# Patient Record
Sex: Female | Born: 1960 | Race: Black or African American | Hispanic: No | Marital: Married | Smoking: Never smoker
Health system: Southern US, Community
[De-identification: ages and names within clinical notes are randomized; demographics above are authoritative.]

## PROBLEM LIST (undated history)

## (undated) DIAGNOSIS — R5383 Other fatigue: Secondary | ICD-10-CM

## (undated) DIAGNOSIS — R404 Transient alteration of awareness: Secondary | ICD-10-CM

## (undated) DIAGNOSIS — Z13220 Encounter for screening for lipoid disorders: Secondary | ICD-10-CM

## (undated) DIAGNOSIS — R7309 Other abnormal glucose: Secondary | ICD-10-CM

## (undated) DIAGNOSIS — J45909 Unspecified asthma, uncomplicated: Secondary | ICD-10-CM

## (undated) DIAGNOSIS — E119 Type 2 diabetes mellitus without complications: Secondary | ICD-10-CM

## (undated) DIAGNOSIS — M1990 Unspecified osteoarthritis, unspecified site: Secondary | ICD-10-CM

## (undated) DIAGNOSIS — K589 Irritable bowel syndrome without diarrhea: Secondary | ICD-10-CM

## (undated) DIAGNOSIS — J309 Allergic rhinitis, unspecified: Secondary | ICD-10-CM

## (undated) DIAGNOSIS — K3189 Other diseases of stomach and duodenum: Secondary | ICD-10-CM

## (undated) DIAGNOSIS — R5381 Other malaise: Secondary | ICD-10-CM

## (undated) DIAGNOSIS — R1013 Epigastric pain: Secondary | ICD-10-CM

## (undated) DIAGNOSIS — K219 Gastro-esophageal reflux disease without esophagitis: Secondary | ICD-10-CM

## (undated) DIAGNOSIS — L439 Lichen planus, unspecified: Secondary | ICD-10-CM

## (undated) DIAGNOSIS — D649 Anemia, unspecified: Secondary | ICD-10-CM

## (undated) DIAGNOSIS — K760 Fatty (change of) liver, not elsewhere classified: Secondary | ICD-10-CM

## (undated) HISTORY — DX: Allergic rhinitis, unspecified: J30.9

## (undated) HISTORY — DX: Encounter for screening for lipoid disorders: Z13.220

## (undated) HISTORY — DX: Epigastric pain: R10.13

## (undated) HISTORY — DX: Other abnormal glucose: R73.09

## (undated) HISTORY — DX: Anemia, unspecified: D64.9

## (undated) HISTORY — DX: Other diseases of stomach and duodenum: K31.89

## (undated) HISTORY — DX: Unspecified asthma, uncomplicated: J45.909

## (undated) HISTORY — DX: Other malaise: R53.81

## (undated) HISTORY — DX: Gastro-esophageal reflux disease without esophagitis: K21.9

## (undated) HISTORY — DX: Irritable bowel syndrome, unspecified: K58.9

## (undated) HISTORY — DX: Other fatigue: R53.83

## (undated) HISTORY — DX: Transient alteration of awareness: R40.4

## (undated) HISTORY — DX: Unspecified osteoarthritis, unspecified site: M19.90

## (undated) HISTORY — PX: CERVIX SURGERY: SHX593

---

## 1994-01-14 HISTORY — PX: TUBAL LIGATION: SHX77

## 2000-04-28 ENCOUNTER — Other Ambulatory Visit: Admission: RE | Admit: 2000-04-28 | Discharge: 2000-04-28 | Payer: Self-pay | Admitting: Obstetrics and Gynecology

## 2000-06-05 ENCOUNTER — Other Ambulatory Visit: Admission: RE | Admit: 2000-06-05 | Discharge: 2000-06-05 | Payer: Self-pay | Admitting: Obstetrics and Gynecology

## 2000-06-05 ENCOUNTER — Encounter (INDEPENDENT_AMBULATORY_CARE_PROVIDER_SITE_OTHER): Payer: Self-pay

## 2002-03-24 ENCOUNTER — Other Ambulatory Visit: Admission: RE | Admit: 2002-03-24 | Discharge: 2002-03-24 | Payer: Self-pay | Admitting: *Deleted

## 2002-04-15 LAB — HM COLONOSCOPY: HM Colonoscopy: NORMAL

## 2003-03-31 ENCOUNTER — Other Ambulatory Visit: Admission: RE | Admit: 2003-03-31 | Discharge: 2003-03-31 | Payer: Self-pay | Admitting: Obstetrics and Gynecology

## 2003-04-07 ENCOUNTER — Ambulatory Visit (HOSPITAL_COMMUNITY): Admission: RE | Admit: 2003-04-07 | Discharge: 2003-04-07 | Payer: Self-pay | Admitting: Obstetrics and Gynecology

## 2004-01-24 ENCOUNTER — Ambulatory Visit (HOSPITAL_COMMUNITY): Admission: RE | Admit: 2004-01-24 | Discharge: 2004-01-24 | Payer: Self-pay | Admitting: Obstetrics and Gynecology

## 2004-03-06 ENCOUNTER — Ambulatory Visit (HOSPITAL_COMMUNITY): Admission: RE | Admit: 2004-03-06 | Discharge: 2004-03-06 | Payer: Self-pay | Admitting: Obstetrics and Gynecology

## 2004-03-06 ENCOUNTER — Encounter (INDEPENDENT_AMBULATORY_CARE_PROVIDER_SITE_OTHER): Payer: Self-pay | Admitting: Specialist

## 2004-06-22 ENCOUNTER — Other Ambulatory Visit: Admission: RE | Admit: 2004-06-22 | Discharge: 2004-06-22 | Payer: Self-pay | Admitting: Obstetrics and Gynecology

## 2004-06-26 ENCOUNTER — Ambulatory Visit (HOSPITAL_COMMUNITY): Admission: RE | Admit: 2004-06-26 | Discharge: 2004-06-26 | Payer: Self-pay | Admitting: Obstetrics and Gynecology

## 2004-07-05 ENCOUNTER — Inpatient Hospital Stay (HOSPITAL_COMMUNITY): Admission: AD | Admit: 2004-07-05 | Discharge: 2004-07-05 | Payer: Self-pay | Admitting: Obstetrics and Gynecology

## 2004-07-12 ENCOUNTER — Encounter: Admission: RE | Admit: 2004-07-12 | Discharge: 2004-07-12 | Payer: Self-pay | Admitting: Obstetrics and Gynecology

## 2004-07-21 ENCOUNTER — Encounter: Admission: RE | Admit: 2004-07-21 | Discharge: 2004-07-21 | Payer: Self-pay | Admitting: Interventional Radiology

## 2004-08-31 ENCOUNTER — Ambulatory Visit (HOSPITAL_COMMUNITY): Admission: RE | Admit: 2004-08-31 | Discharge: 2004-08-31 | Payer: Self-pay | Admitting: Interventional Radiology

## 2004-10-09 ENCOUNTER — Encounter: Admission: RE | Admit: 2004-10-09 | Discharge: 2004-10-09 | Payer: Self-pay | Admitting: Interventional Radiology

## 2005-02-27 ENCOUNTER — Ambulatory Visit: Payer: Self-pay | Admitting: Internal Medicine

## 2005-05-14 ENCOUNTER — Ambulatory Visit: Payer: Self-pay | Admitting: Internal Medicine

## 2005-06-21 ENCOUNTER — Other Ambulatory Visit: Admission: RE | Admit: 2005-06-21 | Discharge: 2005-06-21 | Payer: Self-pay | Admitting: Obstetrics and Gynecology

## 2005-07-09 ENCOUNTER — Ambulatory Visit: Payer: Self-pay | Admitting: Internal Medicine

## 2005-07-09 ENCOUNTER — Encounter (INDEPENDENT_AMBULATORY_CARE_PROVIDER_SITE_OTHER): Payer: Self-pay | Admitting: Specialist

## 2005-09-10 ENCOUNTER — Ambulatory Visit: Payer: Self-pay | Admitting: Internal Medicine

## 2005-12-14 LAB — CONVERTED CEMR LAB: Pap Smear: NORMAL

## 2006-01-14 HISTORY — PX: ABDOMINAL HYSTERECTOMY: SHX81

## 2006-03-18 ENCOUNTER — Encounter (INDEPENDENT_AMBULATORY_CARE_PROVIDER_SITE_OTHER): Payer: Self-pay | Admitting: Specialist

## 2006-03-18 ENCOUNTER — Inpatient Hospital Stay (HOSPITAL_COMMUNITY): Admission: RE | Admit: 2006-03-18 | Discharge: 2006-03-20 | Payer: Self-pay | Admitting: Obstetrics and Gynecology

## 2006-07-03 ENCOUNTER — Ambulatory Visit (HOSPITAL_COMMUNITY): Admission: RE | Admit: 2006-07-03 | Discharge: 2006-07-03 | Payer: Self-pay | Admitting: Obstetrics and Gynecology

## 2006-12-15 ENCOUNTER — Ambulatory Visit: Payer: Self-pay | Admitting: Internal Medicine

## 2007-01-20 ENCOUNTER — Ambulatory Visit: Payer: Self-pay | Admitting: Family Medicine

## 2007-01-20 DIAGNOSIS — R5381 Other malaise: Secondary | ICD-10-CM | POA: Insufficient documentation

## 2007-01-20 DIAGNOSIS — R5383 Other fatigue: Secondary | ICD-10-CM

## 2007-01-20 DIAGNOSIS — J309 Allergic rhinitis, unspecified: Secondary | ICD-10-CM | POA: Insufficient documentation

## 2007-01-20 DIAGNOSIS — K219 Gastro-esophageal reflux disease without esophagitis: Secondary | ICD-10-CM

## 2007-01-20 DIAGNOSIS — K589 Irritable bowel syndrome without diarrhea: Secondary | ICD-10-CM | POA: Insufficient documentation

## 2007-01-27 ENCOUNTER — Ambulatory Visit: Payer: Self-pay | Admitting: Family Medicine

## 2007-01-27 DIAGNOSIS — R404 Transient alteration of awareness: Secondary | ICD-10-CM

## 2007-01-29 LAB — CONVERTED CEMR LAB
ALT: 22 units/L (ref 0–35)
AST: 20 units/L (ref 0–37)
Albumin: 4 g/dL (ref 3.5–5.2)
Basophils Absolute: 0 10*3/uL (ref 0.0–0.1)
Bilirubin, Direct: 0.1 mg/dL (ref 0.0–0.3)
Calcium: 9.3 mg/dL (ref 8.4–10.5)
Chloride: 108 meq/L (ref 96–112)
Cholesterol: 130 mg/dL (ref 0–200)
Eosinophils Absolute: 0.1 10*3/uL (ref 0.0–0.6)
Eosinophils Relative: 1.2 % (ref 0.0–5.0)
GFR calc Af Amer: 100 mL/min
GFR calc non Af Amer: 83 mL/min
Glucose, Bld: 111 mg/dL — ABNORMAL HIGH (ref 70–99)
HDL: 56 mg/dL (ref >39.0)
Lymphocytes Relative: 29.4 % (ref 12.0–46.0)
MCHC: 34 g/dL (ref 30.0–36.0)
MCV: 93 fL (ref 78.0–100.0)
Monocytes Relative: 8.9 % (ref 3.0–11.0)
Neutro Abs: 2.5 10*3/uL (ref 1.4–7.7)
Platelets: 242 10*3/uL (ref 150–400)
RBC: 4.18 M/uL (ref 3.87–5.11)
Sed Rate: 13 mm/hr (ref 0–25)
TSH: 0.64 microintl units/mL (ref 0.35–5.50)
Total CHOL/HDL Ratio: 2.3
Triglycerides: 33 mg/dL (ref 0–149)

## 2007-02-02 ENCOUNTER — Ambulatory Visit: Payer: Self-pay | Admitting: Family Medicine

## 2007-02-03 LAB — CONVERTED CEMR LAB
Basophils Relative: 0.5 % (ref 0.0–1.0)
Eosinophils Relative: 1.4 % (ref 0.0–5.0)
HCT: 37.6 % (ref 36.0–46.0)
Homocysteine: 7.6 micromoles/L (ref 5.00–13.90)
Neutrophils Relative %: 60.7 % (ref 43.0–77.0)
Platelets: 213 10*3/uL (ref 150–400)
RBC: 4.05 M/uL (ref 3.87–5.11)
WBC: 4 10*3/uL — ABNORMAL LOW (ref 4.5–10.5)

## 2007-02-05 DIAGNOSIS — R7309 Other abnormal glucose: Secondary | ICD-10-CM

## 2007-02-05 DIAGNOSIS — R1013 Epigastric pain: Secondary | ICD-10-CM | POA: Insufficient documentation

## 2007-02-05 DIAGNOSIS — D72819 Decreased white blood cell count, unspecified: Secondary | ICD-10-CM | POA: Insufficient documentation

## 2007-02-05 DIAGNOSIS — K3189 Other diseases of stomach and duodenum: Secondary | ICD-10-CM | POA: Insufficient documentation

## 2007-08-07 ENCOUNTER — Ambulatory Visit (HOSPITAL_COMMUNITY): Admission: RE | Admit: 2007-08-07 | Discharge: 2007-08-07 | Payer: Self-pay | Admitting: Obstetrics and Gynecology

## 2007-08-20 ENCOUNTER — Ambulatory Visit: Payer: Self-pay | Admitting: Internal Medicine

## 2007-09-03 ENCOUNTER — Ambulatory Visit: Payer: Self-pay | Admitting: Internal Medicine

## 2007-10-29 ENCOUNTER — Telehealth: Payer: Self-pay | Admitting: Internal Medicine

## 2007-10-30 DIAGNOSIS — Z8719 Personal history of other diseases of the digestive system: Secondary | ICD-10-CM | POA: Insufficient documentation

## 2007-11-02 ENCOUNTER — Ambulatory Visit: Payer: Self-pay | Admitting: Internal Medicine

## 2008-01-13 ENCOUNTER — Ambulatory Visit: Payer: Self-pay | Admitting: Family Medicine

## 2008-01-13 DIAGNOSIS — F341 Dysthymic disorder: Secondary | ICD-10-CM | POA: Insufficient documentation

## 2008-01-13 DIAGNOSIS — R002 Palpitations: Secondary | ICD-10-CM | POA: Insufficient documentation

## 2008-01-13 DIAGNOSIS — R0602 Shortness of breath: Secondary | ICD-10-CM

## 2008-02-02 ENCOUNTER — Ambulatory Visit: Payer: Self-pay | Admitting: Family Medicine

## 2008-02-02 ENCOUNTER — Ambulatory Visit: Payer: Self-pay | Admitting: Professional

## 2008-02-03 LAB — CONVERTED CEMR LAB
AST: 15 units/L (ref 0–37)
Albumin: 4 g/dL (ref 3.5–5.2)
BUN: 7 mg/dL (ref 6–23)
Basophils Relative: 1 % (ref 0–1)
CO2: 24 meq/L (ref 19–32)
Calcium: 9.2 mg/dL (ref 8.4–10.5)
Chloride: 106 meq/L (ref 96–112)
Cholesterol: 117 mg/dL (ref 0–200)
FSH: 31.4 milliintl units/mL
Glucose, Bld: 89 mg/dL (ref 70–99)
HDL: 58 mg/dL (ref >39)
Hemoglobin: 12.3 g/dL (ref 12.0–15.0)
LH: 10.1 milliintl units/mL
Lymphocytes Relative: 32 % (ref 12–46)
Lymphs Abs: 1.3 10*3/uL (ref 0.7–4.0)
Monocytes Relative: 10 % (ref 3–12)
Neutro Abs: 2.3 10*3/uL (ref 1.7–7.7)
Neutrophils Relative %: 56 % (ref 43–77)
Potassium: 4.3 meq/L (ref 3.5–5.3)
RBC: 3.98 M/uL (ref 3.87–5.11)
TSH: 1.113 microintl units/mL (ref 0.350–4.50)
WBC: 4.1 10*3/uL (ref 4.0–10.5)

## 2008-11-10 ENCOUNTER — Ambulatory Visit (HOSPITAL_COMMUNITY): Admission: RE | Admit: 2008-11-10 | Discharge: 2008-11-10 | Payer: Self-pay | Admitting: Obstetrics and Gynecology

## 2009-01-05 ENCOUNTER — Telehealth: Payer: Self-pay | Admitting: Internal Medicine

## 2009-04-13 ENCOUNTER — Ambulatory Visit: Payer: Self-pay | Admitting: Internal Medicine

## 2009-04-13 DIAGNOSIS — L290 Pruritus ani: Secondary | ICD-10-CM | POA: Insufficient documentation

## 2009-09-13 ENCOUNTER — Ambulatory Visit: Payer: Self-pay | Admitting: Family Medicine

## 2009-09-13 DIAGNOSIS — L259 Unspecified contact dermatitis, unspecified cause: Secondary | ICD-10-CM

## 2009-09-25 ENCOUNTER — Telehealth: Payer: Self-pay | Admitting: Family Medicine

## 2009-09-27 ENCOUNTER — Ambulatory Visit: Payer: Self-pay | Admitting: Family Medicine

## 2009-12-18 ENCOUNTER — Observation Stay: Payer: Self-pay | Admitting: Internal Medicine

## 2009-12-19 ENCOUNTER — Encounter: Payer: Self-pay | Admitting: Family Medicine

## 2010-02-04 ENCOUNTER — Encounter: Payer: Self-pay | Admitting: Interventional Radiology

## 2010-02-13 NOTE — Progress Notes (Signed)
Summary: reaction to triamcinolone  Phone Note Call from Patient Call back at [phone]   Caller: Patient Summary of Call: Pt has been using triamcinolone for almost 2 weeks.  She says this has caused a rash on her face and dizziness.  She stopped using the cream last night and is asking what she can do about the rash on her face.  She will use benedryl and see if that helps.  [facility]. Initial call taken by: Lowella Petties CMA,  September 25, 2009 12:17 PM  Follow-up for Phone Call        Dr. B rec f/u in 1-2 weeks if not improving.  dizziness would be almost unheard of from topical TAC.   Reassess to see if not dermatitis Follow-up by: Hannah Beat MD,  September 25, 2009 12:34 PM  Additional Follow-up for Phone Call Additional follow up Details #1::        Patient notified as instructed by telephone. Appointment scheduled with Dr. Ermalene Searing on 09/27/09. Additional Follow-up by: Sydell Axon LPN,  September 25, 2009 5:29 PM

## 2010-02-13 NOTE — Assessment & Plan Note (Signed)
Summary: follow-up on dermatitis   Vital Signs:  Patient profile:   50 year old female Height:      62.5 inches Weight:      145.0 pounds BMI:     26.19 Temp:     98.5 degrees F oral Pulse rate:   72 / minute Pulse rhythm:   regular BP sitting:   98 / 60  (left arm) Cuff size:   regular  Vitals Entered By: Benny Lennert CMA Duncan Dull) (September 27, 2009 11:59 AM)  History of Present Illness: Chief complaint follow up dermatitis  Skin changes on left hip/buttock...since end of July.  Dry, itchy...initially reddish..gets darker as it drys up. Area initially felt warm and sensitive to touch.  Tried OTC hydrocortisone cream...minimal imporvement.  No other location on body.  No new meds, no new vitamin...some new detergent in past few weeks. Has since swithched back   No new lotions, no new exposures.  Given triamcinolone 0.5 % 1 months ago...improved now...almost gone now...some mild darkness remains. Realized may be nickle allergy in area.  Has noted that red itchy rash on face since starting the cream. HAs improved some now.  occ dizzyness over the weekend gone now.  Started taking benadryl..heped alot.   Problems Prior to Update: 1)  Contact Dermatitis  (ICD-692.9) 2)  Pruritus Ani  (ICD-698.0) 3)  Shortness of Breath  (ICD-786.05) 4)  Palpitations  (ICD-785.1) 5)  Depression/anxiety  (ICD-300.4) 6)  Irritable Bowel Syndrome, Hx of  (ICD-V12.79) 7)  Gastritis, Hx of  (ICD-V12.79) 8)  Fh of Colon Cancer  (ICD-153.9) 9)  Epigastric Discomfort  (ICD-789.06) 10)  Dyspepsia, Nonulcerative  (ICD-536.8) 11)  Prediabetes  (ICD-790.29) 12)  Leukopenia, Mild  (ICD-288.50) 13)  Other Alteration of Consciousness  (ICD-780.09) 14)  Screening For Lipoid Disorders  (ICD-V77.91) 15)  Fatigue  (ICD-780.79) 16)  Allergic Rhinitis  (ICD-477.9) 17)  Ibs  (ICD-564.1) 18)  Gerd  (ICD-530.81)  Current Medications (verified): 1)  Nexium 40 Mg  Cpdr (Esomeprazole Magnesium) ....  Take 1 Capsule By Mouth Once A Day 2)  Analpram-Hc 1-2.5 % Crea (Hydrocortisone Ace-Pramoxine) .... Apply To Rectum Three Times A Day  Allergies: 1)  ! Penicillin 2)  ! Amoxicillin  Past History:  Past medical, surgical, family and social histories (including risk factors) reviewed, and no changes noted (except as noted below).  Past Medical History: Reviewed history from 02/05/2007 and no changes required. Current Problems:  EPIGASTRIC DISCOMFORT (ICD-789.06) DYSPEPSIA, NONULCERATIVE (ICD-536.8) PREDIABETES (ICD-790.29) LEUKOPENIA, MILD (ICD-288.50) OTHER ALTERATION OF CONSCIOUSNESS (ICD-780.09) SCREENING FOR LIPOID DISORDERS (ICD-V77.91) FATIGUE (ICD-780.79) ALLERGIC RHINITIS (ICD-477.9) IBS (ICD-564.1) GERD (ICD-530.81)      Past Surgical History: Reviewed history from 10/30/2007 and no changes required. 2008 hysterectomy, removed cervix but left ovaries behind: congested pelvic syndrome C-sections x 2 endoscopy 6/07 nml Tubal ligation 1996  GYN surgery 02/06  Family History: Reviewed history from 10/30/2007 and no changes required. father DM, HTN mother: HTN, IBS, hypothyroidism 2 brothers: HTN, GERD sister: healthy MGF: leukemia, oral cancer Family History of Colon Cancer: Uncle  Social History: Reviewed history from 01/20/2007 and no changes required. Occupation: Transport planner Married 3 sons: autistic, borderline DM Never Smoked Alcohol use-no Drug use-no Regular exercise-yes 2-3 x per week Diet: fruits and veggies, chikn , fish, loves sweets  Physical Exam  General:  Well-developed,well-nourished,in no acute distress; alert,appropriate and cooperative throughout examination Mouth:  Oral mucosa and oropharynx without lesions or exudates.  Teeth in good repair. Lungs:  Normal respiratory effort, chest expands  symmetrically. Lungs are clear to auscultation, no crackles or wheezes. Heart:  Normal rate and regular rhythm. S1 and S2 normal without  gallop, murmur, click, rub or other extra sounds. Skin:  dry flaky dark  pathces on left hip..in linear distribution, present but faded significantly no blisters no pustules.   On face...multiple erythematous papules, no pustules, no oral lesions   Impression & Recommendations:  Problem # 1:  CONTACT DERMATITIS (ICD-692.9) Resolving, but possible allergic reaction on face from triamcinolone. Stop this med and other topical creams. If facial resh not continuign to improve or if leg rash returns. Call for possiblel derm referral.  The following medications were removed from the medication list:    Triamcinolone Acetonide 0.5 % Crea (Triamcinolone acetonide) .Marland Kitchen... Apply to affected area two times a day  no longer than 2 weeks.  Complete Medication List: 1)  Nexium 40 Mg Cpdr (Esomeprazole magnesium) .... Take 1 capsule by mouth once a day 2)  Analpram-hc 1-2.5 % Crea (Hydrocortisone ace-pramoxine) .... Apply to rectum three times a day  Current Allergies (reviewed today): ! PENICILLIN ! AMOXICILLIN

## 2010-02-13 NOTE — Assessment & Plan Note (Signed)
Summary: CHECK DARK PATCH ON HIP/CLE   Vital Signs:  Patient profile:   50 year old female Height:      62.5 inches Weight:      143.25 pounds BMI:     25.88 Temp:     99.0 degrees F oral Pulse rate:   72 / minute Pulse rhythm:   regular BP sitting:   130 / 86  (left arm) Cuff size:   regular  Vitals Entered By: Linde Gillis CMA Duncan Dull) (September 13, 2009 2:15 PM)  CC: check dark patches on left thigh   History of Present Illness: Skin cahnges on left hip/buttock...since end of July.  Dry, itchy...initially reddish..gets darker as it drys up. Area initially felt warm and sensitive to touch.  Tried OTC hydrocortisone cream...minimal imporvement.  No other location on body.  No new meds, no new vitamin...some new detergent in past few weeks. Has since swithched back   No new lotions, no new exposures.  Current Medications (verified): 1)  Nexium 40 Mg  Cpdr (Esomeprazole Magnesium) .... Take 1 Capsule By Mouth Once A Day 2)  Analpram-Hc 1-2.5 % Crea (Hydrocortisone Ace-Pramoxine) .... Apply To Rectum Three Times A Day 3)  Triamcinolone Acetonide 0.5 % Crea (Triamcinolone Acetonide) .... Apply To Affected Area Two Times A Day  No Longer Than 2 Weeks.  Allergies: 1)  ! Penicillin 2)  ! Amoxicillin  Review of Systems General:  Denies fatigue and fever. CV:  Denies chest pain or discomfort. Resp:  Denies shortness of breath.  Physical Exam  General:  Well-developed,well-nourished,in no acute distress; alert,appropriate and cooperative throughout examination Mouth:  MMM Lungs:  Normal respiratory effort, chest expands symmetrically. Lungs are clear to auscultation, no crackles or wheezes. Heart:  Normal rate and regular rhythm. S1 and S2 normal without gallop, murmur, click, rub or other extra sounds. Skin:  dry flaky dark to pink pathces on left hip..in linear distribution no blisters no pustules.    Impression & Recommendations:  Problem # 1:  CONTACT DERMATITIS  (ICD-692.9) No clear blisters, no pustules..some sensitivity at site, but not consistent with appearance of shingles or time course of shingles. Most consistent with dermatitis.Marland Kitchentreat with topical steroid.  Follow up if not improving in 1-2 weeks. Consider bx or derm referral if not improving, Her updated medication list for this problem includes:    Triamcinolone Acetonide 0.5 % Crea (Triamcinolone acetonide) .Marland Kitchen... Apply to affected area two times a day  no longer than 2 weeks.  Complete Medication List: 1)  Nexium 40 Mg Cpdr (Esomeprazole magnesium) .... Take 1 capsule by mouth once a day 2)  Analpram-hc 1-2.5 % Crea (Hydrocortisone ace-pramoxine) .... Apply to rectum three times a day 3)  Triamcinolone Acetonide 0.5 % Crea (Triamcinolone acetonide) .... Apply to affected area two times a day  no longer than 2 weeks.  Patient Instructions: 1)  Apply steroid cream to affected area. 2)  Call if not improving after 1-2 weeks.  Prescriptions: TRIAMCINOLONE ACETONIDE 0.5 % CREA (TRIAMCINOLONE ACETONIDE) Apply to affected area two times a day  no longer than 2 weeks.  #15-30 gm x 0   Entered and Authorized by:   Kerby Nora MD   Signed by:   Kerby Nora MD on 09/13/2009   Method used:   [facility] to        [facility]  [address] [address] [phone][facility] (retail[facility]       [address]       [place]  [place], [place]  [address]       Ph: [phone] or [phone]       Fax: [phone]   RxID:   [id number]   Current Allergies (reviewed today): ! PENICILLIN ! AMOXICILLIN

## 2010-02-13 NOTE — Assessment & Plan Note (Signed)
Summary: ABD DISCOMFORT/YF    History of Present Illness Visit Type: Follow-up Visit Primary GI MD: Lina Sar MD Primary Provider: Sherril Cong Chief Complaint: Intermittant crampy abd pain with some constipation. Pt states she has noticed a increase in rectal itching with some BRB on occasion but she has a history of hemorrhoids.  History of Present Illness:   This is a 50 year old African American female who is here to discuss her rectal itching and irritation. We have seen her in the past for screening colonoscopy because of her family history of colon cancer in a paternal uncle. Her last colonoscopy exam in August 2009 was normal. She is experiencing pruritus around the anal area especially at night. Occasionally she has bleeding from scratching. Her bowel habits tend to be constipated. She denies history of hemorrhoids but had a rectal tear during her vaginal delivery.   GI Review of Systems    Reports abdominal pain and  nausea.     Location of  Abdominal pain: generalized.    Denies acid reflux, belching, bloating, chest pain, dysphagia with liquids, dysphagia with solids, heartburn, loss of appetite, vomiting, vomiting blood, weight loss, and  weight gain.      Reports constipation, hemorrhoids, irritable bowel syndrome, rectal bleeding, and  rectal pain.     Denies anal fissure, black tarry stools, change in bowel habit, diarrhea, diverticulosis, fecal incontinence, heme positive stool, jaundice, light color stool, and  liver problems.    Current Medications (verified): 1)  Nexium 40 Mg  Cpdr (Esomeprazole Magnesium) .... Take 1 Capsule By Mouth Once A Day  Allergies (verified): 1)  ! Penicillin 2)  ! Amoxicillin  Past History:  Past Medical History: Reviewed history from 02/05/2007 and no changes required. Current Problems:  EPIGASTRIC DISCOMFORT (ICD-789.06) DYSPEPSIA, NONULCERATIVE (ICD-536.8) PREDIABETES (ICD-790.29) LEUKOPENIA, MILD (ICD-288.50) OTHER ALTERATION  OF CONSCIOUSNESS (ICD-780.09) SCREENING FOR LIPOID DISORDERS (ICD-V77.91) FATIGUE (ICD-780.79) ALLERGIC RHINITIS (ICD-477.9) IBS (ICD-564.1) GERD (ICD-530.81)      Past Surgical History: Reviewed history from 10/30/2007 and no changes required. 2008 hysterectomy, removed cervix but left ovaries behind: congested pelvic syndrome C-sections x 2 endoscopy 6/07 nml Tubal ligation 1996  GYN surgery 02/06  Family History: Reviewed history from 10/30/2007 and no changes required. father DM, HTN mother: HTN, IBS, hypothyroidism 2 brothers: HTN, GERD sister: healthy MGF: leukemia, oral cancer Family History of Colon Cancer: Uncle  Social History: Reviewed history from 01/20/2007 and no changes required. Occupation: Transport planner Married 3 sons: autistic, borderline DM Never Smoked Alcohol use-no Drug use-no Regular exercise-yes 2-3 x per week Diet: fruits and veggies, chikn , fish, loves sweets  Review of Systems  The patient denies allergy/sinus, anemia, anxiety-new, arthritis/joint pain, back pain, blood in urine, breast changes/lumps, change in vision, confusion, cough, coughing up blood, depression-new, fainting, fatigue, fever, headaches-new, hearing problems, heart murmur, heart rhythm changes, itching, menstrual pain, muscle pains/cramps, night sweats, nosebleeds, pregnancy symptoms, shortness of breath, skin rash, sleeping problems, sore throat, swelling of feet/legs, swollen lymph glands, thirst - excessive , urination - excessive , urination changes/pain, urine leakage, vision changes, and voice change.         Pertinent positive and negative review of systems were noted in the above HPI. All other ROS was otherwise negative.   Vital Signs:  Patient profile:   50 year old female Height:      62.5 inches Weight:      145.13 pounds BMI:     26.22 Pulse rate:   80 / minute Pulse rhythm:  regular BP sitting:   118 / 72  (right arm) Cuff size:   regular  Vitals  Entered By: Christie Nottingham CMA Duncan Dull) (April 13, 2009 3:53 PM)  Physical Exam  General:  Well developed, well nourished, no acute distress. Mouth:  No deformity or lesions, dentition normal. Neck:  Supple; no masses or thyromegaly. Lungs:  Clear throughout to auscultation. Heart:  Regular rate and rhythm; no murmurs, rubs,  or bruits. Abdomen:  Soft, nontender and nondistended. No masses, hepatosplenomegaly or hernias noted. Normal bowel sounds. Rectal:  rectal and anoscopic exam reveals a small hemorrhoidal tag at the rectovaginal wall which prolapses through the anal canal. There is no fissure. Rectal tone is normal. The anal mucosa and rectal ampulla mucosa are both normal. There is a small amount of Hemoccult negative stool and a small amount of stool leaks out to perianal area. Extremities:  No clubbing, cyanosis, edema or deformities noted.   Impression & Recommendations:  Problem # 1:  PRURITUS ANI (ICD-698.0) Patient has external hemorrhoids and skin tags most likely causing rectal irritation. I doubt we are dealing with parasites. We will obtain stool for ova and parasites x 3 for reassurance that there are no parasites and we will start her on Analpram cream 3 times a day and Anusol-HC suppositories q.h.s. She received samples of Calmoseptine ointment to use p.r.n. I advised her to use tux rather than toilet paper. Orders: T-Stool for O&P ([id number]) T-Stool for O&P ([id number]) T-Stool for O&P ([id number])  Patient Instructions: 1)  Please pick up your prescription for anusol suppositories to take at bedtime. 2)  Pick up your prescription fo Analpram to apply to the rectum three times a day. 3)  recall colonoscopy August 2016 4)  Please go to the lab on the basement floor to get stool containers. 5)  Copy sent to : Kerby Nora, MD 6)  The medication list was reviewed and reconciled.  All changed / newly prescribed medications were explained.  A complete medication list  was provided to the patient / caregiver. Prescriptions: ANALPRAM-HC 1-2.5 % CREA (HYDROCORTISONE ACE-PRAMOXINE) Apply to rectum three times a day  #30 grams x 0   Entered by:   Hortense Ramal CMA (AAMA)   Authorized by:   Hart Carwin MD   Signed by:   Hortense Ramal CMA (AAMA) on 04/13/2009   Method used:   [facility]  [address] [phone]       [address]       [place], [place]  [address]       Ph: [phone] or [phone]       Fax: [phone]   RxID:   [id number] ANUSOL-HC 25 MG SUPP (HYDROCORTISONE ACETATE) Insert into rectum at bedtime  #12 x 1   Entered by:   Hortense Ramal CMA (AAMA)   Authorized by:   Hart Carwin MD   Signed by:   Hortense Ramal CMA (AAMA) on 04/13/2009   Method used:   [facility]        [facility]  [address] [phone][facility] (retail[facility]       [address]       [place], [place]  [address]       Ph: [phone] or [phone]       Fax: [phone]   RxID:   [id number]  Appended Document: ABD DISCOMFORT/YF ---- 05/09/2009 1:24 PM, Karen Kitchens Nelson-Smith CMA (AAMA) wrote: Dr Juanda Chance-  Patient was having some rectal itching at her office visit on 04/13/09....we ordered O&P x 3 for her...Marland Kitchenhowever, she never turned those in. Do you still need those or would they be irrelevant at this point?  ---- 05/09/2009 1:28 PM, Hart Carwin MD wrote: Patient was the one to request the test. I will not reorder it at this point.

## 2010-02-15 NOTE — Letter (Signed)
Summary: [facility]  [facility]   Imported By: Maryln Gottron 12/25/2009 12:13:18  _____________________________________________________________________  External Attachment:    Type:   Image     Comment:   External Document

## 2010-05-29 NOTE — Assessment & Plan Note (Signed)
[facility]                         GASTROENTEROLOGY OFFICE NOTE   Brittany Stanton, Brittany Stanton                    MRN:          [id number]  DATE:12/15/2006                            DOB:          [birth year]    Brittany Stanton is a very nice 50 year old [place] American female, former  patient of Dr. Micah Noel , who has a history of gastroesophageal reflux.  We  saw her initially in June 2007, for dyspepsia and epigastric discomfort.  An upper endoscopy then showed an essentially normal exam but the  biopsies at the GE junction showed mild inflammation consistent with  reflux.  She was put on Nexium 40 mg twice a day which she then was able  to use at a once a day dosage.  She is doing quite well on that regimen.  What really helped her a lot was her dietary modification.  She has  eliminated soft drinks, fried foods, and spicy foods.  Her weight has  remained stable.  She has learned how to handle stress better.  She  comes today  for refill of her medications.  She is just getting over an  upper respiratory infection.   PHYSICAL EXAMINATION:  VITAL SIGNS:  Blood pressure 130/70, pulse 70,  and weight 147 pounds.  The patient was not examined.   IMPRESSION:  A 49 year old African American female with gastroesophageal  reflux disease under good control on Nexium 40 mg a day.   PLAN:  1. [facility].  2. Recommendation for internal medicine followup with Dr. Kerby Nora      at [facility].  3. Recall colonoscopy in July 2011.  She has a family history of colon      cancer in indirect relative which was her mother's brother.     Hedwig Morton. Juanda Chance, MD  Electronically Signed    DMB/MedQ  DD: 12/15/2006  DT: 12/15/2006  Job #: [id number]   cc:   Kerby Nora, MD

## 2010-06-01 NOTE — H&P (Signed)
Brittany Stanton, Brittany Stanton             ACCOUNT NO.:  [id number]   MEDICAL RECORD NO.:  [id number]           PATIENT TYPE:   LOCATION:                                 FACILITY:   PHYSICIAN:  Naima A. Dillard, M.D.      DATE OF BIRTH:   DATE OF ADMISSION:  DATE OF DISCHARGE:                                HISTORY & PHYSICAL   CHIEF COMPLAINT:  Uterine fibroids, dysmenorrhea, and irregular bleeding.   HISTORY AND PHYSICAL:  The patient is a 50 year old African-American female,  gravida 3, para 3, who presented with irregular bleeding over the last year.  She had some irregular spotting in between periods.  She also stays that her  periods have been heavy and painful.  The patient had an ultrasound  significant for a uterine size of 9.6 x 5.4 x 6.4 cm with several small  fibroids.  There was also a 9 x 7 x 10 mm hyperechoic area in the  endometrium which was felt to represent a polyp; however, the patient had a  sonohystogram by Dr. Pennie Rushing back in May of 2005, which showed a 2.4 cm  mixed echogenicity area significant for a degenerating fibroid.  The  patient's cervical cultures were found to be negative.   PAST SURGICAL HISTORY:  1.  Significant for a tubal ligation.  2.  Cesarean section x2.   PAST MEDICAL HISTORY:  1.  The patient denies any bleeding disorder.  2.  She does have irritable bowel syndrome.   PAST OBSTETRICAL HISTORY:  1.  Cesarean section x2.  2.  VBAC x1.   PAST GYNECOLOGIC HISTORY:  The patient denies having any abnormal Pap  smears.  She did have a history of Chlamydia back in 1990, and as above.   SOCIAL HISTORY:  Negative for alcohol, tobacco, or drug use.   REVIEW OF SYSTEMS:  ENDOCRINE:  Unremarkable.  GI:  Significant for  irritable bowel syndrome.  CARDIOVASCULAR:  Unremarkable.  PSYCHIATRIC:  Unremarkable.  MUSCULOSKELETAL:  Unremarkable.   ALLERGIES:  1.  SEASONAL.  2.  DUST.  3.  MOLD.  4.  GRASS.  5.  Questionable PENICILLIN allergy.   PHYSICAL EXAMINATION:  VITAL SIGNS:  The patient's blood pressure is 118/60.  Her weight is 131 pounds.  HEENT:  Pupils are equal.  Hearing is normal.  Throat is clear.  Thyroid is  not enlarged.  HEART:  Regular rate and rhythm.  CHEST:  Clear to auscultation bilaterally.  BREASTS:  No masses, discharge, skin changes, or nipple retraction  bilaterally.  BACK:  No CVA tenderness.  ABDOMEN:  Nontender without any masses or organomegaly.  EXTREMITIES:  No clubbing, cyanosis, or edema.  NEUROLOGIC:  Within normal limits.  PELVIC:  Vaginal exam within normal limits.  Uterus top normal size, about  10 weeks size, irregular shape, nontender.  Adnexa had no masses and  nontender.  RECTOVAGINAL:  Without any masses.   PERTINENT LABORATORIES:  TSH was 1.472, prolactin was 11.4.   ASSESSMENT:  Uterine fibroids, irregular vaginal bleeding, and questionable  uterine polyp, or submucosal fibroid, and dysmenorrhea.   PLAN:  The patient plans for a D&C hysteroscopy, removal of fibroid.  The  patient was given the options of D&C hysteroscopy, observation,  hysterectomy, uterine artery embolization.  The patient has chosen to  proceed with hysteroscopy.  Risks and benefits of all the procedures were  reviewed.  The patient understands the risks including, but not limited to,  bleeding, infection, damage to internal organs such as bowel, bladder, and  major blood vessels.  Today, a laminaria __________ was placed, and the  patient is ready to proceed with surgery.      NAD/MEDQ  D:  03/05/2004  T:  03/05/2004  Job:  [id number]

## 2010-06-01 NOTE — Assessment & Plan Note (Signed)
[facility]                           GASTROENTEROLOGY OFFICE NOTE   NAME:Rainone, [facility]                      MRN:          [id number]  DATE:09/10/2005                            DOB:          02/19/1960    Ms. Bucks is a very nice 50 year old African American female with a non-  ulcer dyspepsia, and gastroesophageal reflux.  Repeat upper endoscopy in  June 2007, confirmed presence of mild inflammation at the GE junction,  consistent with reflux.  She was put on Flora-Q probiotic as well as Nexium  40 mg a day with almost complete resolution of her symptoms.  She still has  to be very careful in eating, avoiding fatty foods.  She is very busy at  work and stress seems to bring on some of her symptoms.  Overall, she has  been satisfied with her regimen.   We have discussed today non-ulcer dyspepsia, gastroesophageal reflux.   She is to continue on a low fat, high fiber diet.   1. Nexium 40 mg a day.  2. I have added Levsin sublingually 0.125 mg p.r.n. abdominal discomfort.      This could be used while she is at work when she is stressed out, up to      3 times a day.   I would like her to come and discuss her condition in about 6-12 months.   I have also given her high fiber diet.                                   Hedwig Morton. Juanda Chance, MD   DMB/MedQ  DD:  09/10/2005  DT:  09/10/2005  Job #:  [id number]   cc:   Angelique Holm, Dr.

## 2010-06-01 NOTE — Op Note (Signed)
Brittany Stanton, Brittany Stanton             ACCOUNT NO.:  [id number]   MEDICAL RECORD NO.:  [id number]          PATIENT TYPE:  AMB   LOCATION:  SDC                           FACILITY:  [facility]   PHYSICIAN:  Naima A. Dillard, M.D. DATE OF BIRTH:  Sep 22, 1960   DATE OF PROCEDURE:  03/06/2004  DATE OF DISCHARGE:                                 OPERATIVE REPORT   PREOPERATIVE DIAGNOSES:  1.  Dysmenorrhea.  2.  Uterine fibroids.  3.  Uterine polyp versus submucosal fibroid.   POSTOPERATIVE DIAGNOSES:  1.  Dysmenorrhea.  2.  Uterine fibroids.   PROCEDURE:  Dilatation and curettage, hysteroscopy.   SURGEON:  Naima A. Dillard, M.D.   ASSISTANT:  None.   ANESTHESIA:  General laryngeal mask airway.   SPECIMENS:  Endometrial curettings.   ESTIMATED BLOOD LOSS:  Minimal.   COMPLICATIONS:  None.   DEFICIT:  110 mL of 3% sorbitol fluid.   The patient went to PACU in stable condition.   PROCEDURE IN DETAIL:  The patient was taken to the operating room, where she  was given general laryngeal mask airway anesthesia and prepped and draped in  the normal sterile fashion.  A bivalve speculum was placed into the vagina.  The laminaria that was placed in the office had already fallen out and it  was thrown in the trash.  The anterior lip of the cervix was grasped with a  single-tooth tenaculum.  The uterine sound was placed through the cervix  into the uterus and sounded to about 10 cm.  The cervix was further dilated  with Pratt dilators to 17.  A diagnostic hysteroscope was placed into the  uterine cavity, and both ostia were visualized.  The entire uterine cavity  was visualized.  There was abundant fluffy endometrium, no polyp or  submucosal fibroid was noted.  The  hysteroscope was then removed and a sharp curettage was done of the uterine  cavity until a gritty texture was noted.  The tenaculum was removed from the  cervix with hemostasis noted.  The sponge, lap and needle counts were  correct  x2.  The patient went to the recovery room in stable condition.      NAD/MEDQ  D:  03/06/2004  T:  03/06/2004  Job:  [id number]

## 2010-06-01 NOTE — Op Note (Signed)
Brittany Stanton, Brittany Stanton             ACCOUNT NO.:  [id number]   MEDICAL RECORD NO.:  [id number]          PATIENT TYPE:  INP   LOCATION:  9313                          [facility]ACILITY:  [facility]   PHYSICIAN:  Naima A. Dillard, M.D. DATE OF BIRTH:  10-Aug-1960   DATE OF PROCEDURE:  03/18/2006  DATE OF DISCHARGE:                               OPERATIVE REPORT   PREOPERATIVE DIAGNOSES:  1. Chronic pelvic pain.  2. Uterine fibroids.   POSTOPERATIVE DIAGNOSES:  1. Chronic pelvic pain.  2. Uterine fibroids.   PROCEDURE:  Total abdominal hysterectomy.   SURGEON:  Naima A. Normand Sloop, M.D.   ASSISTANT:  Osborn Coho, M.D.   ANESTHESIA:  General.   SPECIMEN:  The uterus and cervix sent to pathology.   ESTIMATED BLOOD LOSS:  300 mL drop.   URINE OUTPUT:  400 mL of clear urine at end of the procedure.   IV FLUIDS:  2600 mL crystalloid.   COMPLICATIONS:  None.   The patient went to PACU in stable condition.   PROCEDURE IN DETAIL:  The patient was taken to the operating room, where  she was given general anesthesia, placed in the dorsal supine position,  and prepped and draped in the normal sterile fashion.  The Foley  catheter was placed as the vagina was also prepped.  The patient was  examined and noted to have a 10-week size uterus with no adnexal masses  under anesthesia.  The patient was then turned to the abdomen, where a  Pfannenstiel skin incision was made with a scalpel and carried down to  the fascia using Bovie cautery.  The fascia was incised in the midline,  extended bilaterally using pickups with teeth and Mayo scissors.  Kochers x2 were placed on the inferior aspect of the fascia.  It was  dissected off the rectus muscle both sharply and bluntly.  The superior  aspect of the fascia was dissected in a similar fashion.  The peritoneum  was identified, tented up and entered sharply, extended superiorly and  inferiorly with good visualization of bowel and bladder.  The bowel was  packed away with moist laparotomy sponges.  The Balfour retractor was  placed.  Bladder blade was placed.  The patient's round ligaments were  clamped, cut and tied, and the vesicouterine peritoneum was entered  sharply from both sides and cut.  The bladder was then both bluntly and  sharply dissected away from the uterus and also the cervix.  Both  uterine-ovarian ligaments were doubly clamped, cut, free tied first, and  then suture ligated.  Hemostasis was assured.  The bladder was further  retracted away off of the lower uterine segment and cervix.  The uterine  arteries were skeletonized, clamped, cut and suture ligated.  Hemostasis  was assured.  The bladder was then further dissected off of the lower  uterine segment and cervix.  The cardinal ligaments were clamped, cut  and suture ligated.  Hemostasis was assured.  At this point the bladder  was way off of the cervix.  Both uterosacral ligaments were clamped, cut  and suture ligated.  Hemostasis was assured.  The uterus and cervix were  removed from the vagina Jorgensons.  The vaginal cuff was closed using  figure-of-eight 0 Vicryl stitches.  Hemostasis was assured.  Irrigation  was done and the hemostasis was assured.  There was a small bleeding  area along the left side of the cuff, which was made with a figure-of-  eight stitch.  There is some bleeding where the bladder had been  dissected that was made hemostatic using Bovie cautery and Gelfoam.  Hemostasis was assured.  All instruments were removed from the abdomen.  Both ovaries and tubes were noted to be normal.  The abdominal anatomy  was noted to be normal.  The peritoneum was closed with 0 chromic in a  running fashion.  The muscles were irrigated.  Any bleeding areas were  made hemostatic.  The fascia was closed with 0 Vicryl from each side and  meeting in the middle.  The subcutaneous tissue was irrigated, made  hemostatic with Bovie cautery, and reapproximated using 2-0  plain.  The  skin was closed using a 3-0 Monocryl in a subcuticular stitch.  Sponge,  lap and needle counts were correct.  The patient to the recovery room in  stable condition.      Naima A. Normand Sloop, M.D.  Electronically Signed     NAD/MEDQ  D:  03/18/2006  T:  03/18/2006  Job:  [id number]

## 2010-06-01 NOTE — Discharge Summary (Signed)
NAMEMAGDALENE, Brittany Stanton             ACCOUNT NO.:  [id number]   MEDICAL RECORD NO.:  [id number]          PATIENT TYPE:  INP   LOCATION:  9313                          [facility]ILITY[facility]   PHYSICIAN:  Brittany A. Dillard, M.D. DATE OF BIRTH:  06-Dec-1960   DATE OF ADMISSION:  03/18/2006  DATE OF DISCHARGE:  03/20/2006                               DISCHARGE SUMMARY   DISCHARGE DIAGNOSES:  1. Chronic pelvic pain.  2. Uterine fibroids.  3. Adenomyosis.   OPERATION:  On the date of admission, the patient underwent a total  abdominal hysterectomy, tolerating procedures well.   HISTORY OF PRESENT ILLNESS:  Brittany Stanton is a 50 year old, African-  American female who presents for hysterectomy because of symptomatic  uterine fibroids and chronic pelvic pain.  Please see the patient's  dictated history and physical examination for details.   PREOPERATIVE PHYSICAL EXAMINATION:  Blood pressure 120/80, weight is  145.  GENERAL EXAM:  Is within normal limits.  PELVIC EXAM:  Vaginal exam within normal limits.  Cervix is nontender  without lesions.  Uterus is normal size, shape and consistency.  Adnexa,  no masses and no tenderness.   HOSPITAL COURSE:  On the date of admission, the patient underwent  aforementioned procedure, tolerating it well.  Postoperative course was  unremarkable with the patient achieving her best pain management with  Toradol.  Postoperative hemoglobin was 10.9 (preoperative hemoglobin  12.3).  By postoperative day #2, the patient had resumed bowel and  bladder function and was therefore deemed ready for discharge home.   DISCHARGE LABORATORY DATA:  CBC:  White blood count 14.2, hemoglobin  10.9, hematocrit 32.3, platelets 229.  Basic metabolic panel was within  normal limits.   DISCHARGE MEDICATIONS:  1. Colace 1 tablet twice daily until bowel movements are regular.  2. Anaprox 1 tablet every 12 hours as needed for pain.  3. Dilaudid 1-2 tablets every 3 hours as needed  for pain.  4. Toradol 1 tablet every 6 hours for 3 days after which time the      patient may then began her Anaprox.   FOLLOW UP:  The patient is to call [facility] at [phone]  to schedule a 6-week postoperative visit with Dr. Normand Stanton.   DISCHARGE INSTRUCTIONS:  The patient was advised to call for a  temperature of greater than 100.4 degrees Fahrenheit orally, any  excessive pain, any difficulty voiding, if she has not had a bowel  movement in 3 days or for other questions.  The patient was further  advised to avoid driving for 2 weeks, heavy lifting for 6 weeks (nothing  greater than 10 pounds), no sexual activity for 6 weeks and that she  should increase her activities slowly.  The patient's diet was without  restriction.   FINAL PATHOLOGY:  Uterus hysterectomy:  Uterine cervix - benign  ectocervical and endocervical mucosa; uterine corpus - benign  endometrial polyp and proliferative endometrium, myometrium with  adenomyosis, three intramural and one subserosal leiomyoma.      Brittany Stanton.      Brittany Stanton, M.D.  Electronically Signed  EJP/MEDQ  D:  04/14/2006  T:  04/14/2006  Job:  [id number]

## 2010-06-01 NOTE — H&P (Signed)
NAMESUZANNA, Brittany Stanton             ACCOUNT NO.:  [id number]   MEDICAL RECORD NO.:  [id number]          PATIENT TYPE:  AMB   LOCATION:  SDC                           [facility]   PHYSICIAN:  Brittany A. Dillard, M.D. DATE OF BIRTH:  [birth year]   DATE OF ADMISSION:  DATE OF DISCHARGE:                              HISTORY & PHYSICAL   CHIEF COMPLAINT:  Chronic pelvic pain and fibroids.   The patient is a 50 year old African-American female who presented to me  in January 2008 saying that her pelvic pain was back.  The patient rates  the pain as 7/10, increasing in frequency.  It was once a month, and now  it is like every other day.  It does decrease to 3/10 with Tylenol.  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  The patient has not  had a period, though, since November 2007 after the ablation.  She does  have occasional night sweats.  No hot flashes, no vaginal dryness or  mood swings.  The patient says that she has tried every other method and  desires to have hysterectomy.   PAST SURGICAL HISTORY:  Significant for cesarean section x2 and a tubal  ligation.   PAST MEDICAL HISTORY:  The patient denies having any bleeding disorder.   PAST OB HISTORY:  Significant for VBAC x1 and cesarean section x2.   PAST GYN HISTORY:  The patient denies having any abnormal Pap smears.  She does have a history of chlamydia back in 1989 or 1990 that was  treated without difficulty.   SOCIAL HISTORY:  Negative for alcohol, tobacco, and drug use.   REVIEW OF SYSTEMS:  GENITOURINARY:  As above.  ENDOCRINE:  No thyroid  disorder.  GI: Significant for irritable bowel syndrome.  CARDIOVASCULAR:  Unremarkable.  PSYCHIATRIC:  Unremarkable.  MUSCULOSKELETAL:  Unremarkable.   ALLERGIES:  SEASONAL, DUST, MOLD, GRASS, questionable PENICILLIN  allergy.   MEDICATIONS:  Include Tylenol p.r.n. pelvic pain.   PHYSICAL EXAMINATION:  VITAL SIGNS: Blood pressure 120/80.  She weighs  145 pounds.  HEENT:  Pupils are equal.  Hearing is normal.  Throat is clear.  NECK:  Thyroid is not enlarged.  HEART: Regular rate and rhythm.  LUNGS: Clear to auscultation bilaterally.  BREASTS:  No masses, discharge, skin changes, or nipple retraction.  BACK:  No CVA tenderness.  ABDOMEN: Nontender without mass or organomegaly.  EXTREMITIES:  No cyanosis, clubbing, or edema.  NEUROLOGIC:  Exam is within normal limits.  PELVIC:  Vaginal exam within normal limits.  Cervix is nontender without  lesions.  Uterus normal shape, size, and consistency.  Adnexa has no  masses and nontender.   PERTINENT LABORATORY DATA:  Pap smear was normal.  TSH and FSH both  normal.  UA was within normal limits.  Urine pregnancy test was  negative.   Uterus measures 9 x 6 x 7 with 2 fibroids, the largest being 2.3 cm in  size.   ASSESSMENT:  1. Chronic pelvic pain.  2. Symptomatic uterine fibroids.   PLAN:  The patient was given all the options and signed consent. She  desires to proceed with hysterectomy.  She understands the risks are but  not limited to bleeding, infection, damage to internal organs such as  bowel, bladder, major blood vessels.  She has a vertical incision and  had two cesarean sections.  She was given a bowel prep.      Brittany Stanton, M.D.  Electronically Signed     NAD/MEDQ  D:  03/17/2006  T:  03/17/2006  Job:  [id number]

## 2010-09-20 ENCOUNTER — Encounter: Payer: Self-pay | Admitting: Family Medicine

## 2010-09-20 ENCOUNTER — Ambulatory Visit (INDEPENDENT_AMBULATORY_CARE_PROVIDER_SITE_OTHER): Payer: PRIVATE HEALTH INSURANCE | Admitting: Family Medicine

## 2010-09-20 DIAGNOSIS — Z13220 Encounter for screening for lipoid disorders: Secondary | ICD-10-CM

## 2010-09-20 DIAGNOSIS — R600 Localized edema: Secondary | ICD-10-CM | POA: Insufficient documentation

## 2010-09-20 DIAGNOSIS — R609 Edema, unspecified: Secondary | ICD-10-CM

## 2010-09-20 DIAGNOSIS — R7309 Other abnormal glucose: Secondary | ICD-10-CM

## 2010-09-20 LAB — CBC WITH DIFFERENTIAL/PLATELET
Eosinophils Relative: 1.2 % (ref 0.0–5.0)
HCT: 37.8 % (ref 36.0–46.0)
Hemoglobin: 12.7 g/dL (ref 12.0–15.0)
Lymphs Abs: 1.3 10*3/uL (ref 0.7–4.0)
MCV: 94.3 fl (ref 78.0–100.0)
Monocytes Absolute: 0.3 10*3/uL (ref 0.1–1.0)
Monocytes Relative: 7.4 % (ref 3.0–12.0)
Neutro Abs: 2.5 10*3/uL (ref 1.4–7.7)
Platelets: 225 10*3/uL (ref 150.0–400.0)
RDW: 12.9 % (ref 11.5–14.6)

## 2010-09-20 LAB — COMPREHENSIVE METABOLIC PANEL
Alkaline Phosphatase: 61 U/L (ref 39–117)
CO2: 28 mEq/L (ref 19–32)
Creatinine, Ser: 0.8 mg/dL (ref 0.4–1.2)
GFR: 105.98 mL/min (ref >60.00)
Glucose, Bld: 85 mg/dL (ref 70–99)
Total Bilirubin: 0.8 mg/dL (ref 0.3–1.2)

## 2010-09-20 LAB — LIPID PANEL
Cholesterol: 136 mg/dL (ref 0–200)
HDL: 68.8 mg/dL
LDL Cholesterol: 61 mg/dL (ref 0–99)
Total CHOL/HDL Ratio: 2
Triglycerides: 33 mg/dL (ref 0.0–149.0)
VLDL: 6.6 mg/dL (ref 0.0–40.0)

## 2010-09-20 LAB — TSH: TSH: 0.71 u[IU]/mL (ref 0.35–5.50)

## 2010-09-20 NOTE — Assessment & Plan Note (Signed)
Most likely due to venous insufficency.  She was concerned about gout or OA, but she does not have any joint pain, redness etc. We will eval for other causes of peripheral swelling inculding TSH, kidney, liver issues. Also due for yearly lipid and glucose eval.  Recommended compression hose, elevation above head of legs.

## 2010-09-20 NOTE — Patient Instructions (Signed)
Venous stasis or venous insufficency. Compression hose.. 15-30 mmHg compression. Elevate feet above head. We will call you with lab results. Call if any changes.

## 2010-09-20 NOTE — Progress Notes (Signed)
  Subjective:    Patient ID: Brittany Stanton, female    DOB: 09-15-1960, 50 y.o.   MRN: [id number]  HPI  50 year old female with swelling in both ankles, worse on right vs left. Worse after being on feet at end of day. Ongoing x few weeks. No pain in calf. 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    Has had occ episodes in past but usually does not last so long. No med changes. BP nml range here and at home.     Review of Systems  Constitutional: Positive for fatigue.       Chronically  Respiratory: Positive for shortness of breath. Negative for wheezing.        Mild with stress  Cardiovascular: Negative for chest pain and palpitations.  Gastrointestinal: Negative for nausea, abdominal pain, diarrhea and constipation.       Objective:   Physical Exam  Constitutional: Vital signs are normal. She appears well-developed and well-nourished. She is cooperative.  Non-toxic appearance. She does not appear ill. No distress.  HENT:  Head: Normocephalic.  Right Ear: Hearing, tympanic membrane, external ear and ear canal normal. Tympanic membrane is not erythematous, not retracted and not bulging.  Left Ear: Hearing, tympanic membrane, external ear and ear canal normal. Tympanic membrane is not erythematous, not retracted and not bulging.  Nose: No mucosal edema or rhinorrhea. Right sinus exhibits no maxillary sinus tenderness and no frontal sinus tenderness. Left sinus exhibits no maxillary sinus tenderness and no frontal sinus tenderness.  Mouth/Throat: Uvula is midline, oropharynx is clear and moist and mucous membranes are normal.  Eyes: Conjunctivae, EOM and lids are normal. Pupils are equal, round, and reactive to light. No foreign bodies found.  Neck: Trachea normal and normal range of motion. Neck supple. Carotid bruit is not present. No mass and no thyromegaly present.  Cardiovascular: Normal rate, regular rhythm, S1 normal, S2 normal, normal heart  sounds, intact distal pulses and normal pulses.  Exam reveals no gallop and no friction rub.   No murmur heard. Pulmonary/Chest: Effort normal and breath sounds normal. Not tachypneic. No respiratory distress. She has no decreased breath sounds. She has no wheezes. She has no rhonchi. She has no rales.  Abdominal: Soft. Normal appearance and bowel sounds are normal. There is no tenderness.  Musculoskeletal:       Full ROM of B ankles  minimal swelling in both ankles, nonpitting No redness, no pain to palpation in ankles or feet No deformity.  Neurological: She is alert.  Skin: Skin is warm, dry and intact. No rash noted.  Psychiatric: Her speech is normal and behavior is normal. Judgment and thought content normal. Her mood appears not anxious. Cognition and memory are normal. She does not exhibit a depressed mood.          Assessment & Plan:

## 2010-09-28 ENCOUNTER — Encounter: Payer: Self-pay | Admitting: Family Medicine

## 2010-09-28 ENCOUNTER — Ambulatory Visit (INDEPENDENT_AMBULATORY_CARE_PROVIDER_SITE_OTHER): Payer: PRIVATE HEALTH INSURANCE | Admitting: Family Medicine

## 2010-09-28 ENCOUNTER — Ambulatory Visit (INDEPENDENT_AMBULATORY_CARE_PROVIDER_SITE_OTHER)
Admission: RE | Admit: 2010-09-28 | Discharge: 2010-09-28 | Disposition: A | Discharge status: Home / Self Care | Payer: PRIVATE HEALTH INSURANCE | Source: Ambulatory Visit | Attending: Family Medicine | Admitting: Family Medicine

## 2010-09-28 DIAGNOSIS — R200 Anesthesia of skin: Secondary | ICD-10-CM | POA: Insufficient documentation

## 2010-09-28 DIAGNOSIS — M542 Cervicalgia: Secondary | ICD-10-CM

## 2010-09-28 DIAGNOSIS — R209 Unspecified disturbances of skin sensation: Secondary | ICD-10-CM

## 2010-09-28 DIAGNOSIS — M79604 Pain in right leg: Secondary | ICD-10-CM | POA: Insufficient documentation

## 2010-09-28 DIAGNOSIS — M79609 Pain in unspecified limb: Secondary | ICD-10-CM

## 2010-09-28 MED ORDER — CYCLOBENZAPRINE HCL 10 MG PO TABS: 10.0000 mg | ORAL_TABLET | Freq: Three times a day (TID) | ORAL | Status: AC | PRN

## 2010-09-28 MED ORDER — MELOXICAM 15 MG PO TABS: 15.0000 mg | ORAL_TABLET | Freq: Every day | ORAL | Status: DC

## 2010-09-28 NOTE — Patient Instructions (Signed)
Start meloxicam for antinflammatory.. Take once a day with food. Stop at front to set up nerve conduction with marion. We will call you with X-ray results. Start neck and low back stretching exercises.
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
## 2010-10-12 NOTE — Assessment & Plan Note (Addendum)
Associated with numbness, but Spurling's, tinel phalen and ulnatr compression not clearly positive....will send for nerve conduction to eval for peripheral nerve issue.

## 2010-10-12 NOTE — Assessment & Plan Note (Signed)
Most consistent with mild sciatica. Start NSAIDs, heat and gentle home PT.

## 2010-11-23 ENCOUNTER — Other Ambulatory Visit (HOSPITAL_COMMUNITY): Payer: Self-pay | Admitting: Obstetrics and Gynecology

## 2010-11-23 DIAGNOSIS — Z1231 Encounter for screening mammogram for malignant neoplasm of breast: Secondary | ICD-10-CM

## 2010-12-26 ENCOUNTER — Ambulatory Visit (HOSPITAL_COMMUNITY)
Admission: RE | Admit: 2010-12-26 | Discharge: 2010-12-26 | Disposition: A | Discharge status: Home / Self Care | Payer: PRIVATE HEALTH INSURANCE | Source: Ambulatory Visit | Attending: Obstetrics and Gynecology | Admitting: Obstetrics and Gynecology

## 2010-12-26 DIAGNOSIS — Z1231 Encounter for screening mammogram for malignant neoplasm of breast: Secondary | ICD-10-CM | POA: Insufficient documentation

## 2011-01-12 ENCOUNTER — Other Ambulatory Visit: Payer: Self-pay | Admitting: Internal Medicine

## 2011-02-06 ENCOUNTER — Telehealth: Payer: Self-pay | Admitting: Family Medicine

## 2011-02-06 NOTE — Telephone Encounter (Signed)
Triage Record Num: [id number] Operator: Di Kindle Patient Name: Brittany Stanton Call Date & Time: 02/06/2011 9:38:08AM Patient Phone: [phone] PCP: Kerby Nora Patient Gender: Female PCP Fax : [phone] Patient DOB: Jul 24, 1960 Practice Name: [facility] Reason for Call: [place] call, Caller: Lexee/Patient; PCP: Excell Seltzer.; CB#: [phone]; Caller reports onset 02/02/11 sore throat, Slight Cough, Headache With Pressure, clear nasal drainage. Body Aches, T 101. Per CSR "THE PATIENT REFUSED 911" exposed to flu. Using OTC cold and flu, at work currently. Symptoms reveiwed URI Guideline, with all triage questions negative, verbalizes understanding of home care with call back parameters reviewed. Protocol(s) Used: Upper Respiratory Infection (URI) Recommended Outcome per Protocol: Provide Home/Self Care Reason for Outcome: New onset of two or more of the following symptoms: nasal congestion with runny nose; sneezing; itchy or mild sore throat; mild headache or body aches; mild fatigue; low grade fever up to 101.5 F (38.6C) usually lasting about a week Care Advice: ~ Call provider if symptoms worsen or new symptoms develop. ~ If you can, stop smoking now and avoid all secondhand smoke. Mild symptoms of a cold can be expected to last 7 to 10 days. Sometimes, a cough associated with a cold can last up to 3 weeks. Over-the-counter cold medications may temporarily relieve the symptoms, but do not shorten the length of the cold. ~ A warm, moist compress placed on face, over eyes for 15 to 20 minutes, 5 to 6 times a day, may help relieve the congestion. ~ Most adults need to drink 6-10 eight-ounce glasses (1.2-2.0 liters) of fluids per day unless previously told to limit fluid intake for other medical reasons. Limit fluids that contain caffeine, sugar or alcohol. Urine will be a very light yellow color when you drink enough fluids. ~ Sore Throat Relief: - Use warm  salt water gargles 3 to 4 times/day, as needed (1/2 tsp. salt in 8 oz. [.2 liters] water). - Suck on hard candy, nonprescription or herbal throat lozenges (sugar-free if diabetic) - Eat soothing, soft food/fluids (broths, soups, or honey and lemon juice in hot tea, Popsicles, frozen yogurt or sherbet, scrambled eggs, cooked cereals, Jell-O or puddings) whichever is most comforting. - Avoid eating salty, spicy or acidic foods. ~ 02/06/2011 9:47:33AM Page 1 of 1 [id number]

## 2011-07-17 ENCOUNTER — Encounter: Payer: Self-pay | Admitting: *Deleted

## 2011-07-25 ENCOUNTER — Ambulatory Visit: Payer: Self-pay | Admitting: Family Medicine

## 2011-07-30 ENCOUNTER — Ambulatory Visit (INDEPENDENT_AMBULATORY_CARE_PROVIDER_SITE_OTHER): Payer: PRIVATE HEALTH INSURANCE | Admitting: Internal Medicine

## 2011-07-30 ENCOUNTER — Encounter: Payer: Self-pay | Admitting: Internal Medicine

## 2011-07-30 VITALS — BP 128/64 | HR 60 | Ht 62.0 in | Wt 157.0 lb

## 2011-07-30 DIAGNOSIS — R7989 Other specified abnormal findings of blood chemistry: Secondary | ICD-10-CM

## 2011-07-30 DIAGNOSIS — R1011 Right upper quadrant pain: Secondary | ICD-10-CM

## 2011-07-30 MED ORDER — ALIGN 4 MG PO CAPS: 1.0000 | ORAL_CAPSULE | Freq: Every day | ORAL | Status: DC

## 2011-07-30 NOTE — Patient Instructions (Addendum)
You have been scheduled for a HIDA scan at [facility] (1st floor) on 08/08/11. Please arrive 15 minutes prior to your scheduled appointment at 8:00 am. Make certain not to have anything to eat or drink at least 6 hours prior to your test. Should this appointment date or time not work well for you, please call radiology scheduling at [phone].  We have given you samples of Align. This puts good bacteria back into your colon. You should take 1 capsule by mouth once daily. If this works well for you, it can be purchased over the counter. CC: Dr Jerl Mina

## 2011-07-30 NOTE — Progress Notes (Signed)
Brittany Stanton 23-Feb-1960 MRN [id number]   History of Present Illness:  This is a 51 year old African American female with several months history of right upper quadrant abdominal discomfort which bothers her mostly after meals. She has a strong family history of gallbladder disease in her mother, her sister and a maternal grandmother. A recent upper abdominal ultrasound at [facility] was normal, showing a 2 mm common bile duct. There was no mention of fatty liver and no mention regarding gallbladder wall thickness. She has a history of gastroesophageal reflux which has been well-controlled with Nexium 40 mg daily. Prior upper endoscopies in 2004 and 2007 for dyspepsia were essentially negative. She suffers from chronic constipation. There is a family history of colon cancer in a maternal uncle. Her last colonoscopy in August 2009 was normal. She has gained 15 pounds in the last year. She has a mild abnormality of liver function tests as of last week with mild elevation of her ALT at 57 with normal AST and alkaline phosphatase.   Past Medical History  Diagnosis Date  . Abdominal pain, epigastric   . Dyspepsia and other specified disorders of function of stomach   . Other abnormal glucose   . Decreased white blood cell count   . Other alteration of consciousness   . Screening for lipoid disorders   . Other malaise and fatigue   . Allergic rhinitis, cause unspecified   . Irritable bowel syndrome   . Esophageal reflux    Past Surgical History  Procedure Date  . Cesarean section     x 2  . Abdominal hysterectomy   . Tubal ligation     reports that she has never smoked. She has never used smokeless tobacco. She reports that she does not drink alcohol or use illicit drugs. family history includes Cancer in her maternal grandfather; Colon cancer in an unspecified family member; Diabetes in her father; GER disease in her brothers; Hypertension in her brothers, father, and mother;  Irritable bowel syndrome in her mother; Leukemia in her maternal grandfather; and Thyroid disease in her mother. Allergies  Allergen Reactions  . Amoxicillin     REACTION: rash  . Penicillins     REACTION: Rash        Review of Systems: Denies dysphagia odynophagia chest pain or shortness of breath  The remainder of the 10 point ROS is negative except as outlined in H&P   Physical Exam: General appearance  Well developed, in no distress. Eyes- non icteric. HEENT nontraumatic, normocephalic. Mouth no lesions, tongue papillated, no cheilosis. Neck supple without adenopathy, thyroid not enlarged, no carotid bruits, no JVD. Lungs Clear to auscultation bilaterally. Cor normal S1, normal S2, regular rhythm, no murmur,  quiet precordium. Abdomen: Soft, voluntary guarding, normal active bowel sounds, tender along right costal margin, increased tenderness on inspiration. Mild tenderness of the liver. Rectal: Small skin tags externally. Normal rectal sphincter tone. Formed, Hemoccult negative stool. Extremities no pedal edema. Skin no lesions. Neurological alert and oriented x 3. Psychological normal mood and affect.  Assessment and Plan:  Problem #1 Right upper quadrant abdominal discomfort of unclear etiology. Several possibilities include fatty liver resulting from weight gain. Mild liver function test elevation would support this diagnosis. A malfunctioning gallbladder is in the differential despite of normal appearance on ultrasound. She has a strong family history of gallbladder disease in several relatives. We will go ahead with a HIDA scan with CCK. Another possibility is chronic constipation with hepatic flexure syndrome. She reports improvement  in the right upper quadrant discomfort after having a bowel movement. I have given her samples of probiotics and she will take over-the-counter herbal preparations to maintain regular bowel habits. I have encouraged her to lose weight. We are  requesting a full report of the upper abdominal ultrasound that did did not mention the liver echodensity or size, as well as splenic size.  Problem #2 Gastroesophageal reflux disease controlled with Nexium 40 mg.  Problem #3 Family history of colon cancer in an indirect relative. She is up-to-date on her colonoscopy. Her last exam in 2009 was normal.    07/30/2011 Lina Sar

## 2011-08-08 ENCOUNTER — Other Ambulatory Visit (HOSPITAL_COMMUNITY): Payer: PRIVATE HEALTH INSURANCE

## 2011-08-08 ENCOUNTER — Telehealth: Payer: Self-pay | Admitting: Internal Medicine

## 2011-08-08 NOTE — Telephone Encounter (Signed)
Spoke with patient and gave her the number to [facility] radiology to reschedule her test.

## 2011-08-08 NOTE — Telephone Encounter (Signed)
Patient was scheduled for a test at [facility] this morning.  She woke up "under the weather".  Would like to get it rescheduled.  Please call her.

## 2011-08-19 ENCOUNTER — Encounter (HOSPITAL_COMMUNITY)
Admission: RE | Admit: 2011-08-19 | Discharge: 2011-08-19 | Disposition: A | Discharge status: Home / Self Care | Payer: PRIVATE HEALTH INSURANCE | Source: Ambulatory Visit | Attending: Internal Medicine | Admitting: Internal Medicine

## 2011-08-19 ENCOUNTER — Other Ambulatory Visit: Payer: Self-pay | Admitting: *Deleted

## 2011-08-19 DIAGNOSIS — R7989 Other specified abnormal findings of blood chemistry: Secondary | ICD-10-CM | POA: Insufficient documentation

## 2011-08-19 MED ORDER — TECHNETIUM TC 99M MEBROFENIN IV KIT
5.4000 | PACK | Freq: Once | INTRAVENOUS | Status: AC | PRN
  Administered 2011-08-19: 5 via INTRAVENOUS

## 2011-08-19 MED ORDER — DICYCLOMINE HCL 10 MG PO CAPS: ORAL_CAPSULE | ORAL | Status: DC

## 2012-04-03 ENCOUNTER — Ambulatory Visit (INDEPENDENT_AMBULATORY_CARE_PROVIDER_SITE_OTHER): Payer: PRIVATE HEALTH INSURANCE | Admitting: Internal Medicine

## 2012-04-03 ENCOUNTER — Encounter: Payer: Self-pay | Admitting: Internal Medicine

## 2012-04-03 ENCOUNTER — Other Ambulatory Visit (INDEPENDENT_AMBULATORY_CARE_PROVIDER_SITE_OTHER): Payer: PRIVATE HEALTH INSURANCE

## 2012-04-03 VITALS — BP 102/60 | HR 76 | Ht 62.5 in | Wt 159.0 lb

## 2012-04-03 DIAGNOSIS — R1013 Epigastric pain: Secondary | ICD-10-CM

## 2012-04-03 DIAGNOSIS — K3189 Other diseases of stomach and duodenum: Secondary | ICD-10-CM

## 2012-04-03 DIAGNOSIS — R7401 Elevation of levels of liver transaminase levels: Secondary | ICD-10-CM

## 2012-04-03 DIAGNOSIS — R1011 Right upper quadrant pain: Secondary | ICD-10-CM

## 2012-04-03 LAB — HEPATIC FUNCTION PANEL
AST: 23 U/L (ref 0–37)
Albumin: 4.1 g/dL (ref 3.5–5.2)
Alkaline Phosphatase: 88 U/L (ref 39–117)
Total Protein: 7.3 g/dL (ref 6.0–8.3)

## 2012-04-03 LAB — AMYLASE: Amylase: 103 U/L (ref 27–131)

## 2012-04-03 NOTE — Patient Instructions (Addendum)
You have been scheduled for an endoscopy with propofol. Please follow written instructions given to you at your visit today. If you use inhalers (even only as needed), please bring them with you on the day of your procedure.  Your physician has requested that you go to the basement for the following lab work before leaving today: Hepatic Panel, Amylase, Lipase  CC: Dr Jerl Mina

## 2012-04-03 NOTE — Progress Notes (Signed)
Brittany Stanton 03-31-1960 MRN [id number]  History of Present Illness:  This is a 52 year old African American female with persistent right costal margin pain which bothers her on a daily basis and even at night. It is worse after certain meals. She is on Nexium 40 mg daily for control of her reflux symptoms. She has some food intolerance. Her pain is sometimes improved after having a bowel movement. We saw her last July 2013 for the same reason and she underwent an evaluation which included a negative upper abdominal ultrasound and a borderline abnormal HIDA scan with CCK showing an ejection fraction of 27% with normal being greater than 30%. She developed pain with a CCK infusion. She has minor abnormalities of liver function tests; specifically transaminases. An upper endoscopy in June 2007 for dyspepsia was normal. There is a strong family history of gallbladder disease in her sister who had a cholecystectomy in her mid 73s. There is also a history in her grandmother and grandmother's sister. There is a positive family history of colon cancer in her maternal uncle. Her colonoscopy in August 2009 was normal.   Past Medical History  Diagnosis Date  . Abdominal pain, epigastric   . Dyspepsia and other specified disorders of function of stomach   . Other abnormal glucose   . Decreased white blood cell count   . Other alteration of consciousness   . Screening for lipoid disorders   . Other malaise and fatigue   . Allergic rhinitis, cause unspecified   . Irritable bowel syndrome   . Esophageal reflux    Past Surgical History  Procedure Laterality Date  . Cesarean section      x 2  . Abdominal hysterectomy    . Tubal ligation      reports that she has never smoked. She has never used smokeless tobacco. She reports that she does not drink alcohol or use illicit drugs. family history includes Cancer in her maternal grandfather; Colon cancer in an unspecified family member; Diabetes in her  father; GER disease in her brothers; Hypertension in her brothers, father, and mother; Irritable bowel syndrome in her mother; Leukemia in her maternal grandfather; and Thyroid disease in her mother. Allergies  Allergen Reactions  . Amoxicillin     REACTION: rash  . Penicillins     REACTION: Rash        Review of Systems: Denies heartburn. Positive for dyspepsia and bloating  The remainder of the 10 point ROS is negative except as outlined in H&P   Physical Exam: General appearance  Well developed, in no distress. Eyes- non icteric. HEENT nontraumatic, normocephalic. Mouth no lesions, tongue papillated, no cheilosis. Neck supple without adenopathy, thyroid not enlarged, no carotid bruits, no JVD. Lungs Clear to auscultation bilaterally. Abd very tender right upper quadrant at costal margin. On inspiration, pain is worse. Liver appears to be slightly tender. Left upper quadrant and rest of the abdomen is unremarkable. Bowel sounds are normal active. Rectal: Not done. Extremities no pedal edema. Skin no lesions. Neurological alert and oriented x 3. Psychological normal mood and affect.  Assessment and Plan:  Problem #1 Persistent right upper quadrant pain in the setting of borderline abnormal HIDA scan with reproduction of her pain with CCK. She had minor abnormalities of liver function tests and a strong family history of and female side of her family. An upper endoscopy in 2007 was normal. We will proceed with a repeat upper endoscopy and continue Nexium 40 mg daily. We will repeat  liver function tests, amylase and lipase. I think she will need a surgical referral for consideration of cholecystectomy providing her upper endoscopy does not show any major abnormalities. I discussed this with the patient and she agrees with the plan.    04/03/2012 Lina Sar

## 2012-04-14 ENCOUNTER — Ambulatory Visit (AMBULATORY_SURGERY_CENTER): Payer: PRIVATE HEALTH INSURANCE | Admitting: Internal Medicine

## 2012-04-14 ENCOUNTER — Encounter: Payer: Self-pay | Admitting: Internal Medicine

## 2012-04-14 VITALS — BP 120/80 | HR 65 | Temp 98.2°F | Resp 13 | Ht 62.0 in | Wt 159.0 lb

## 2012-04-14 DIAGNOSIS — R1013 Epigastric pain: Secondary | ICD-10-CM

## 2012-04-14 DIAGNOSIS — K2970 Gastritis, unspecified, without bleeding: Secondary | ICD-10-CM

## 2012-04-14 DIAGNOSIS — Z8719 Personal history of other diseases of the digestive system: Secondary | ICD-10-CM

## 2012-04-14 DIAGNOSIS — K219 Gastro-esophageal reflux disease without esophagitis: Secondary | ICD-10-CM

## 2012-04-14 DIAGNOSIS — R1011 Right upper quadrant pain: Secondary | ICD-10-CM

## 2012-04-14 DIAGNOSIS — K2990 Gastroduodenitis, unspecified, without bleeding: Secondary | ICD-10-CM

## 2012-04-14 DIAGNOSIS — K3189 Other diseases of stomach and duodenum: Secondary | ICD-10-CM

## 2012-04-14 MED ORDER — SODIUM CHLORIDE 0.9 % IV SOLN: 500.0000 mL | INTRAVENOUS | Status: DC

## 2012-04-14 NOTE — Patient Instructions (Addendum)

## 2012-04-14 NOTE — Progress Notes (Signed)
Patient did not have preoperative order for IV antibiotic SSI prophylaxis. (G8918)  Patient did not experience any of the following events: a burn prior to discharge; a fall within the facility; wrong site/side/patient/procedure/implant event; or a hospital transfer or hospital admission upon discharge from the facility. (G8907)  

## 2012-04-14 NOTE — Op Note (Signed)
[facility] [address] [place], [address]   ENDOSCOPY PROCEDURE REPORT  PATIENT: Brittany Stanton, Brittany Stanton  MR#: [id number] BIRTHDATE: [birth year] , 51  yrs. old GENDER: Female ENDOSCOPIST: Hart Carwin, MD REFERRED BY:  Jerl Mina, M.D. PROCEDURE DATE:  04/14/2012 PROCEDURE:  EGD w/ biopsy ASA CLASS:     Class I INDICATIONS:  abdominal pain in the upper right quadrant.   Gerd controlled with Nexiem, abnormal HIDA scan 27%, EF, CCK reproduced the pain, positive family hx of gall bladder disease.  Marland Kitchen MEDICATIONS: MAC sedation, administered by CRNA and propofol (Diprivan) 250mg  IV TOPICAL ANESTHETIC: none  DESCRIPTION OF PROCEDURE: After the risks benefits and alternatives of the procedure were thoroughly explained, informed consent was obtained.  The LB GIF-H180 [id number] endoscope was introduced through the mouth and advanced to the second portion of the duodenum. Without limitations.  The instrument was slowly withdrawn as the mucosa was fully examined.      The upper, middle and distal third of the esophagus were carefully inspected and no abnormalities were noted.  The z-line was well seen at the GEJ.  The endoscope was pushed into the fundus which was normal including a retroflexed view.  The antrum, gastric body, first and second part of the duodenum were unremarkable.  A biopsy was performed.  Retroflexed views revealed no abnormalities. The scope was then withdrawn from the patient and the procedure completed.  COMPLICATIONS: There were no complications. ENDOSCOPIC IMPRESSION: Normal upper endoscopy, s/p biopsy gastric antrum to r/o H.Pylori nothing to account for pt's symptoms  RECOMMENDATIONS: Await pathology results continue Nexiem discuss surgical consultation for lap chole  REPEAT EXAM: no  eSigned:  Hart Carwin, MD 04/14/2012 4:24 PM   CC:  PATIENT NAME:  Brittany Stanton, Brittany Stanton MR#: [id number]

## 2012-04-15 ENCOUNTER — Telehealth: Payer: Self-pay

## 2012-04-15 DIAGNOSIS — K219 Gastro-esophageal reflux disease without esophagitis: Secondary | ICD-10-CM

## 2012-04-15 MED ORDER — ESOMEPRAZOLE MAGNESIUM 40 MG PO CPDR: 40.0000 mg | DELAYED_RELEASE_CAPSULE | Freq: Every day | ORAL | Status: DC

## 2012-04-15 NOTE — Telephone Encounter (Signed)
  Follow up Call-  Call back number 04/14/2012  Post procedure Call Back phone  # [phone]  Permission to leave phone message Yes     Patient questions:  Do you have a fever, pain , or abdominal swelling? no Pain Score  0 *  Have you tolerated food without any problems? yes  Have you been able to return to your normal activities? yes  Do you have any questions about your discharge instructions: Diet   no Medications  no Follow up visit  no  Do you have questions or concerns about your Care? no  Actions: * If pain score is 4 or above: No action needed, pain <4.

## 2012-04-20 ENCOUNTER — Encounter: Payer: Self-pay | Admitting: Internal Medicine

## 2012-04-24 ENCOUNTER — Telehealth: Payer: Self-pay | Admitting: Internal Medicine

## 2012-04-24 DIAGNOSIS — R1011 Right upper quadrant pain: Secondary | ICD-10-CM

## 2012-04-24 DIAGNOSIS — R1013 Epigastric pain: Secondary | ICD-10-CM

## 2012-04-24 NOTE — Telephone Encounter (Signed)
Please refer to Dr Abbey Chatters.

## 2012-04-24 NOTE — Telephone Encounter (Signed)
Patient states she is still having pain and would like a surgical referral. She wants to come to [facility] for the consult and does not have a surgeon she prefers. Please, advise.

## 2012-04-24 NOTE — Telephone Encounter (Signed)
Spoke with Brittany Stanton at [facility] and scheduled with Dr. Abbey Chatters on 05/12/12 at 9:00 AM. Patient notified.

## 2012-05-12 ENCOUNTER — Encounter (INDEPENDENT_AMBULATORY_CARE_PROVIDER_SITE_OTHER): Payer: Self-pay | Admitting: General Surgery

## 2012-05-12 ENCOUNTER — Ambulatory Visit (INDEPENDENT_AMBULATORY_CARE_PROVIDER_SITE_OTHER): Payer: PRIVATE HEALTH INSURANCE | Admitting: General Surgery

## 2012-05-12 VITALS — BP 118/70 | HR 80 | Temp 98.3°F | Resp 18 | Ht 62.0 in | Wt 159.0 lb

## 2012-05-12 DIAGNOSIS — K828 Other specified diseases of gallbladder: Secondary | ICD-10-CM | POA: Insufficient documentation

## 2012-05-12 NOTE — Progress Notes (Signed)
Patient ID: Brittany Stanton, female   DOB: 04/13/1960, 51 y.o.   MRN: [id number]  Chief Complaint  Patient presents with  . New Evaluation    Lap chole ?    HPI Brittany Stanton is a 51 y.o. female.   HPI  She is referred by Dr. [name] because of gallbladder dysfunction.  For about a year and a half, she's had intermittent right upper quadrant sharp pains. This is particularly after eating a fatty or spicy meal. Recently after eating about anything sometimes. She does have some nausea these episodes. No certain fever or chills. Occasional diarrhea. She underwent a right upper quadrant ultrasound demonstrated a normal gallbladder. A nuclear medicine hepatobiliary scan demonstrated a decreased ejection fraction consistent with biliary dyskinesia. Upper endoscopy was unremarkable. She sent over here to discuss cholecystectomy.  Past Medical History  Diagnosis Date  . Abdominal pain, epigastric   . Dyspepsia and other specified disorders of function of stomach   . Other abnormal glucose   . Decreased white blood cell count   . Other alteration of consciousness   . Screening for lipoid disorders   . Other malaise and fatigue   . Allergic rhinitis, cause unspecified   . Irritable bowel syndrome   . Esophageal reflux   . Anemia   . Arthritis     Past Surgical History  Procedure Laterality Date  . Abdominal hysterectomy  2008  . Tubal ligation  1996  . Cesarean section  1996\1990    x 2  . Cervix surgery      D/C    Family History  Problem Relation Age of Onset  . Hypertension Mother   . Irritable bowel syndrome Mother   . Thyroid disease Mother   . Diabetes Father   . Hypertension Father   . Hypertension Brother   . GER disease Brother   . Cancer Maternal Grandfather     oral  . Leukemia Maternal Grandfather   . Hypertension Brother   . GER disease Brother   . Colon cancer Maternal Uncle     Social History History  Substance Use Topics  . Smoking status: Never  Smoker   . Smokeless tobacco: Never Used  . Alcohol Use: No    Allergies  Allergen Reactions  . Amoxicillin     REACTION: rash  . Penicillins     REACTION: Rash    Current Outpatient Prescriptions  Medication Sig Dispense Refill  . esomeprazole (NEXIUM) 40 MG capsule Take 1 capsule (40 mg total) by mouth daily before breakfast.  30 capsule  11  . Probiotic Product (ALIGN) 4 MG CAPS Take 1 capsule by mouth daily.  14 capsule  0   No current facility-administered medications for this visit.    Review of Systems Review of Systems  Constitutional:       Night sweats.  Weight gain.  Menopausal.  HENT: Positive for sore throat.   Respiratory: Negative.   Cardiovascular: Negative.   Gastrointestinal: Positive for nausea and abdominal pain.  Genitourinary: Negative.   Neurological: Positive for weakness.  Hematological: Negative.     Blood pressure 118/70, pulse 80, temperature 98.3 F (36.8 C), resp. rate 18, height 5' 2" (1.575 m), weight 159 lb (72.122 kg).  Physical Exam Physical Exam  Constitutional: No distress.  Overweight.  HENT:  Head: Normocephalic and atraumatic.  Eyes: EOM are normal. No scleral icterus.  Wears glasses.  Neck: Neck supple.  Cardiovascular: Normal rate and regular rhythm.   Pulmonary/Chest:   Effort normal and breath sounds normal.  Abdominal: Soft. She exhibits no distension and no mass.  Lower midline and lower transverse scars. No incisional hernias.  Musculoskeletal: She exhibits no edema.  Lymphadenopathy:    She has no cervical adenopathy.  Neurological: She is alert.  Skin: Skin is warm and dry.  Psychiatric: She has a normal mood and affect. Her behavior is normal.    Data Reviewed Ultrasound report, hepatobiliary scan report, notes from Dr. Brodie.  Assessment    Increasingly symptomatic biliary dyskinesia.     Plan    We discussed laparoscopic cholecystectomy and the success rate of about 80% for this condition. She is  interested in proceeding with the operation.  I have explained the procedure, risks, and aftercare of cholecystectomy.  Risks include but are not limited to bleeding, infection, wound problems, anesthesia, diarrhea, bile leak, injury to common bile duct/liver/intestine.  She seems to understand and agrees to proceed.         Pressley Barsky J 05/12/2012, 10:04 AM    

## 2012-05-12 NOTE — Patient Instructions (Signed)
Strict lowfat diet. 

## 2012-05-30 NOTE — Pre-Procedure Instructions (Signed)
Brittany Stanton  05/30/2012   Your procedure is scheduled on:  Tuesday, May 27th  Report to [facility] at 0530 AM.  Call this number if you have problems the morning of surgery: [phone]   Remember:   Do not eat food or drink liquids after midnight.    Take these medicines the morning of surgery with A SIP OF WATER: Nexium   Do not wear jewelry, make-up or nail polish.  Do not wear lotions, powders, or perfumes. You may wear deodorant.  Do not shave 48 hours prior to surgery. Men may shave face and neck.  Do not bring valuables to the hospital.  Contacts, dentures or bridgework may not be worn into surgery.  Leave suitcase in the car. After surgery it may be brought to your room.  For patients admitted to the hospital, checkout time is 11:00 AM the day of discharge.   Patients discharged the day of surgery will not be allowed to drive home.   Special Instructions: Shower using CHG 2 nights before surgery and the night before surgery.  If you shower the day of surgery use CHG.  Use special wash - you have one bottle of CHG for all showers.  You should use approximately 1/3 of the bottle for each shower.   Please read over the following fact sheets that you were given: Pain Booklet, Coughing and Deep Breathing, MRSA Information and Surgical Site Infection Prevention

## 2012-06-01 ENCOUNTER — Telehealth (INDEPENDENT_AMBULATORY_CARE_PROVIDER_SITE_OTHER): Payer: Self-pay | Admitting: *Deleted

## 2012-06-01 ENCOUNTER — Encounter (HOSPITAL_COMMUNITY)
Admission: RE | Admit: 2012-06-01 | Discharge: 2012-06-01 | Disposition: A | Discharge status: Home / Self Care | Payer: PRIVATE HEALTH INSURANCE | Source: Ambulatory Visit | Attending: General Surgery | Admitting: General Surgery

## 2012-06-01 ENCOUNTER — Encounter (HOSPITAL_COMMUNITY): Payer: Self-pay

## 2012-06-01 DIAGNOSIS — Z01818 Encounter for other preprocedural examination: Secondary | ICD-10-CM | POA: Insufficient documentation

## 2012-06-01 DIAGNOSIS — Z01812 Encounter for preprocedural laboratory examination: Secondary | ICD-10-CM | POA: Insufficient documentation

## 2012-06-01 HISTORY — DX: Type 2 diabetes mellitus without complications: E11.9

## 2012-06-01 HISTORY — DX: Fatty (change of) liver, not elsewhere classified: K76.0

## 2012-06-01 HISTORY — DX: Lichen planus, unspecified: L43.9

## 2012-06-01 LAB — COMPREHENSIVE METABOLIC PANEL
ALT: 56 U/L — ABNORMAL HIGH (ref 0–35)
AST: 39 U/L — ABNORMAL HIGH (ref 0–37)
Albumin: 3.7 g/dL (ref 3.5–5.2)
Alkaline Phosphatase: 101 U/L (ref 39–117)
CO2: 24 mEq/L (ref 19–32)
Chloride: 105 mEq/L (ref 96–112)
GFR calc non Af Amer: >90 mL/min (ref >90)
Potassium: 4.2 mEq/L (ref 3.5–5.1)
Sodium: 141 mEq/L (ref 135–145)
Total Bilirubin: 0.3 mg/dL (ref 0.3–1.2)

## 2012-06-01 LAB — CBC
Platelets: 204 10*3/uL (ref 150–400)
RBC: 4.21 MIL/uL (ref 3.87–5.11)
RDW: 12.2 % (ref 11.5–15.5)
WBC: 4.3 10*3/uL (ref 4.0–10.5)

## 2012-06-01 LAB — PROTIME-INR: INR: 1 (ref 0.00–1.49)

## 2012-06-01 NOTE — Progress Notes (Signed)
Call to [facility] in Dr. Maris Berger office, requested that the preop antibiotic be reviewed due to pt. Report of Hives /w Penicillin  In the past.

## 2012-06-01 NOTE — Telephone Encounter (Signed)
Pre-op called to ask about changing patient's pre-surgery antibiotic from the cefazolin due to patient's allergy to Penicillin which patient described as giving her a rash from head to toe previously.

## 2012-06-02 NOTE — Telephone Encounter (Signed)
Please change to Ciprofloxacin 400 mg IV preop.

## 2012-06-08 MED ORDER — CEFAZOLIN SODIUM-DEXTROSE 2-3 GM-% IV SOLR
2.0000 g | INTRAVENOUS | Status: DC
  Filled 2012-06-08: qty 50

## 2012-06-09 ENCOUNTER — Ambulatory Visit (HOSPITAL_COMMUNITY)
Admission: RE | Admit: 2012-06-09 | Discharge: 2012-06-09 | Disposition: A | Discharge status: Home / Self Care | Payer: PRIVATE HEALTH INSURANCE | Source: Ambulatory Visit | Attending: General Surgery | Admitting: General Surgery

## 2012-06-09 ENCOUNTER — Ambulatory Visit (HOSPITAL_COMMUNITY): Payer: PRIVATE HEALTH INSURANCE | Admitting: Anesthesiology

## 2012-06-09 ENCOUNTER — Encounter (HOSPITAL_COMMUNITY): Payer: Self-pay

## 2012-06-09 ENCOUNTER — Encounter (HOSPITAL_COMMUNITY)
Admission: RE | Disposition: A | Discharge status: Home / Self Care | Payer: Self-pay | Source: Ambulatory Visit | Attending: General Surgery

## 2012-06-09 ENCOUNTER — Ambulatory Visit (HOSPITAL_COMMUNITY): Payer: PRIVATE HEALTH INSURANCE

## 2012-06-09 ENCOUNTER — Encounter (HOSPITAL_COMMUNITY): Payer: Self-pay | Admitting: Anesthesiology

## 2012-06-09 DIAGNOSIS — J309 Allergic rhinitis, unspecified: Secondary | ICD-10-CM | POA: Insufficient documentation

## 2012-06-09 DIAGNOSIS — R7309 Other abnormal glucose: Secondary | ICD-10-CM | POA: Insufficient documentation

## 2012-06-09 DIAGNOSIS — Z880 Allergy status to penicillin: Secondary | ICD-10-CM | POA: Insufficient documentation

## 2012-06-09 DIAGNOSIS — K219 Gastro-esophageal reflux disease without esophagitis: Secondary | ICD-10-CM | POA: Insufficient documentation

## 2012-06-09 DIAGNOSIS — Z01812 Encounter for preprocedural laboratory examination: Secondary | ICD-10-CM | POA: Insufficient documentation

## 2012-06-09 DIAGNOSIS — M129 Arthropathy, unspecified: Secondary | ICD-10-CM | POA: Insufficient documentation

## 2012-06-09 DIAGNOSIS — N959 Unspecified menopausal and perimenopausal disorder: Secondary | ICD-10-CM | POA: Insufficient documentation

## 2012-06-09 DIAGNOSIS — K811 Chronic cholecystitis: Secondary | ICD-10-CM

## 2012-06-09 DIAGNOSIS — E663 Overweight: Secondary | ICD-10-CM | POA: Insufficient documentation

## 2012-06-09 DIAGNOSIS — K589 Irritable bowel syndrome without diarrhea: Secondary | ICD-10-CM | POA: Insufficient documentation

## 2012-06-09 DIAGNOSIS — D649 Anemia, unspecified: Secondary | ICD-10-CM | POA: Insufficient documentation

## 2012-06-09 HISTORY — PX: CHOLECYSTECTOMY: SHX55

## 2012-06-09 LAB — GLUCOSE, CAPILLARY
Glucose-Capillary: 93 mg/dL (ref 70–99)
Glucose-Capillary: 103 mg/dL — ABNORMAL HIGH (ref 70–99)
Glucose-Capillary: 114 mg/dL — ABNORMAL HIGH (ref 70–99)

## 2012-06-09 SURGERY — LAPAROSCOPIC CHOLECYSTECTOMY WITH INTRAOPERATIVE CHOLANGIOGRAM
Anesthesia: General | Site: Abdomen | Wound class: Clean

## 2012-06-09 MED ORDER — BUPIVACAINE HCL (PF) 0.25 % IJ SOLN
INTRAMUSCULAR | Status: AC
  Filled 2012-06-09: qty 30

## 2012-06-09 MED ORDER — LACTATED RINGERS IV SOLN
INTRAVENOUS | Status: DC | PRN
  Administered 2012-06-09 (×2): via INTRAVENOUS

## 2012-06-09 MED ORDER — KETOROLAC TROMETHAMINE 30 MG/ML IJ SOLN
INTRAMUSCULAR | Status: DC | PRN
  Administered 2012-06-09: 30 mg via INTRAVENOUS

## 2012-06-09 MED ORDER — LIDOCAINE HCL (CARDIAC) 20 MG/ML IV SOLN
INTRAVENOUS | Status: DC | PRN
  Administered 2012-06-09: 60 mg via INTRAVENOUS

## 2012-06-09 MED ORDER — HYDROMORPHONE HCL PF 1 MG/ML IJ SOLN: 0.2500 mg | INTRAMUSCULAR | Status: DC | PRN

## 2012-06-09 MED ORDER — ROCURONIUM BROMIDE 100 MG/10ML IV SOLN
INTRAVENOUS | Status: DC | PRN
  Administered 2012-06-09: 30 mg via INTRAVENOUS

## 2012-06-09 MED ORDER — CIPROFLOXACIN IN D5W 400 MG/200ML IV SOLN
400.0000 mg | Freq: Two times a day (BID) | INTRAVENOUS | Status: DC
  Administered 2012-06-09: 400 mg via INTRAVENOUS
  Filled 2012-06-09 (×2): qty 200

## 2012-06-09 MED ORDER — ONDANSETRON HCL 4 MG/2ML IJ SOLN: 4.0000 mg | Freq: Four times a day (QID) | INTRAMUSCULAR | Status: DC | PRN

## 2012-06-09 MED ORDER — NEOSTIGMINE METHYLSULFATE 1 MG/ML IJ SOLN
INTRAMUSCULAR | Status: DC | PRN
  Administered 2012-06-09: 3 mg via INTRAVENOUS

## 2012-06-09 MED ORDER — LIDOCAINE HCL 4 % MT SOLN
OROMUCOSAL | Status: DC | PRN
  Administered 2012-06-09: 4 mL via TOPICAL

## 2012-06-09 MED ORDER — OXYCODONE HCL 5 MG PO TABS: 5.0000 mg | ORAL_TABLET | ORAL | Status: DC | PRN

## 2012-06-09 MED ORDER — IOHEXOL 300 MG/ML  SOLN
INTRAMUSCULAR | Status: DC | PRN
  Administered 2012-06-09: 50 mL via INTRAVENOUS

## 2012-06-09 MED ORDER — MIDAZOLAM HCL 5 MG/5ML IJ SOLN
INTRAMUSCULAR | Status: DC | PRN
  Administered 2012-06-09: 2 mg via INTRAVENOUS

## 2012-06-09 MED ORDER — ONDANSETRON HCL 4 MG/2ML IJ SOLN
INTRAMUSCULAR | Status: DC | PRN
  Administered 2012-06-09: 4 mg via INTRAVENOUS

## 2012-06-09 MED ORDER — ONDANSETRON HCL 4 MG/2ML IJ SOLN
4.0000 mg | Freq: Once | INTRAMUSCULAR | Status: DC | PRN
  Filled 2012-06-09: qty 2

## 2012-06-09 MED ORDER — PROPOFOL 10 MG/ML IV BOLUS
INTRAVENOUS | Status: DC | PRN
  Administered 2012-06-09: 200 mg via INTRAVENOUS

## 2012-06-09 MED ORDER — DEXAMETHASONE SODIUM PHOSPHATE 4 MG/ML IJ SOLN
INTRAMUSCULAR | Status: DC | PRN
  Administered 2012-06-09: 4 mg via INTRAVENOUS

## 2012-06-09 MED ORDER — BUPIVACAINE-EPINEPHRINE 0.25% -1:200000 IJ SOLN
INTRAMUSCULAR | Status: DC | PRN
  Administered 2012-06-09: 10 mL
  Administered 2012-06-09: 20 mL

## 2012-06-09 MED ORDER — ONDANSETRON HCL 4 MG/2ML IJ SOLN
4.0000 mg | Freq: Once | INTRAMUSCULAR | Status: AC
  Administered 2012-06-09: 4 mg via INTRAVENOUS

## 2012-06-09 MED ORDER — SODIUM CHLORIDE 0.9 % IR SOLN
Status: DC | PRN
  Administered 2012-06-09: 1000 mL

## 2012-06-09 MED ORDER — PHENYLEPHRINE HCL 10 MG/ML IJ SOLN
INTRAMUSCULAR | Status: DC | PRN
  Administered 2012-06-09: 40 ug via INTRAVENOUS
  Administered 2012-06-09: 80 ug via INTRAVENOUS

## 2012-06-09 MED ORDER — GLYCOPYRROLATE 0.2 MG/ML IJ SOLN
INTRAMUSCULAR | Status: DC | PRN
  Administered 2012-06-09: 0.2 mg via INTRAVENOUS
  Administered 2012-06-09: .4 mg via INTRAVENOUS

## 2012-06-09 MED ORDER — FENTANYL CITRATE 0.05 MG/ML IJ SOLN
INTRAMUSCULAR | Status: DC | PRN
  Administered 2012-06-09: 150 ug via INTRAVENOUS
  Administered 2012-06-09: 50 ug via INTRAVENOUS

## 2012-06-09 SURGICAL SUPPLY — 48 items
APL SKNCLS STERI-STRIP NONHPOA (GAUZE/BANDAGES/DRESSINGS) ×2
APPLIER CLIP 5 13 M/L LIGAMAX5 (MISCELLANEOUS) ×3
APR CLP MED LRG 5 ANG JAW (MISCELLANEOUS) ×2
BAG SPEC RTRVL LRG 6X4 10 (ENDOMECHANICALS) ×2
BENZOIN TINCTURE PRP APPL 2/3 (GAUZE/BANDAGES/DRESSINGS) ×3 IMPLANT
BLADE SURG ROTATE 9660 (MISCELLANEOUS) ×3 IMPLANT
CANISTER SUCTION 2500CC (MISCELLANEOUS) ×3 IMPLANT
CHLORAPREP W/TINT 26ML (MISCELLANEOUS) ×3 IMPLANT
CLIP APPLIE 5 13 M/L LIGAMAX5 (MISCELLANEOUS) ×2 IMPLANT
CLOTH BEACON ORANGE TIMEOUT ST (SAFETY) ×3 IMPLANT
CLSR STERI-STRIP ANTIMIC 1/2X4 (GAUZE/BANDAGES/DRESSINGS) ×2 IMPLANT
COVER MAYO STAND STRL (DRAPES) ×2 IMPLANT
COVER SURGICAL LIGHT HANDLE (MISCELLANEOUS) ×3 IMPLANT
DECANTER SPIKE VIAL GLASS SM (MISCELLANEOUS) ×1 IMPLANT
DRAPE C-ARM 42X72 X-RAY (DRAPES) ×2 IMPLANT
DRAPE UTILITY 15X26 W/TAPE STR (DRAPE) ×6 IMPLANT
DRSG TEGADERM 4X4.75 (GAUZE/BANDAGES/DRESSINGS) ×2 IMPLANT
ELECT REM PT RETURN 9FT ADLT (ELECTROSURGICAL) ×3
ELECTRODE REM PT RTRN 9FT ADLT (ELECTROSURGICAL) ×2 IMPLANT
GAUZE SPONGE 2X2 8PLY STRL LF (GAUZE/BANDAGES/DRESSINGS) ×2 IMPLANT
GLOVE BIO SURGEON STRL SZ7 (GLOVE) ×2 IMPLANT
GLOVE BIOGEL PI IND STRL 7.0 (GLOVE) ×1 IMPLANT
GLOVE BIOGEL PI IND STRL 8 (GLOVE) ×2 IMPLANT
GLOVE BIOGEL PI INDICATOR 7.0 (GLOVE) ×1
GLOVE BIOGEL PI INDICATOR 8 (GLOVE) ×1
GLOVE ECLIPSE 8.0 STRL XLNG CF (GLOVE) ×3 IMPLANT
GLOVE SKINSENSE NS SZ7.0 (GLOVE) ×1
GLOVE SKINSENSE STRL SZ7.0 (GLOVE) ×1 IMPLANT
GOWN STRL NON-REIN LRG LVL3 (GOWN DISPOSABLE) ×12 IMPLANT
KIT BASIN OR (CUSTOM PROCEDURE TRAY) ×3 IMPLANT
KIT ROOM TURNOVER OR (KITS) ×3 IMPLANT
NS IRRIG 1000ML POUR BTL (IV SOLUTION) ×3 IMPLANT
PAD ARMBOARD 7.5X6 YLW CONV (MISCELLANEOUS) ×6 IMPLANT
POUCH SPECIMEN RETRIEVAL 10MM (ENDOMECHANICALS) ×3 IMPLANT
SCISSORS LAP 5X35 DISP (ENDOMECHANICALS) ×2 IMPLANT
SET CHOLANGIOGRAPH 5 50 .035 (SET/KITS/TRAYS/PACK) ×2 IMPLANT
SET IRRIG TUBING LAPAROSCOPIC (IRRIGATION / IRRIGATOR) ×3 IMPLANT
SLEEVE ENDOPATH XCEL 5M (ENDOMECHANICALS) ×6 IMPLANT
SPECIMEN JAR SMALL (MISCELLANEOUS) ×3 IMPLANT
SPONGE GAUZE 2X2 STER 10/PKG (GAUZE/BANDAGES/DRESSINGS) ×1
SUT MON AB 4-0 PC3 18 (SUTURE) ×3 IMPLANT
TOWEL OR 17X24 6PK STRL BLUE (TOWEL DISPOSABLE) ×3 IMPLANT
TOWEL OR 17X26 10 PK STRL BLUE (TOWEL DISPOSABLE) ×3 IMPLANT
TRAY LAPAROSCOPIC (CUSTOM PROCEDURE TRAY) ×3 IMPLANT
TROCAR XCEL BLUNT TIP 100MML (ENDOMECHANICALS) ×3 IMPLANT
TROCAR XCEL NON-BLD 11X100MML (ENDOMECHANICALS) IMPLANT
TROCAR XCEL NON-BLD 5MMX100MML (ENDOMECHANICALS) ×3 IMPLANT
WATER STERILE IRR 1000ML POUR (IV SOLUTION) IMPLANT

## 2012-06-09 NOTE — Interval H&P Note (Signed)
History and Physical Interval Note:  06/09/2012 7:29 AM  Brittany Stanton  has presented today for surgery, with the diagnosis of bilinary dyskinesia  The various methods of treatment have been discussed with the patient and family. After consideration of risks, benefits and other options for treatment, the patient has consented to  Procedure(s): LAPAROSCOPIC CHOLECYSTECTOMY (N/A) as a surgical intervention .  The patient's history has been reviewed, patient examined, no change in status, stable for surgery.  I have reviewed the patient's chart and labs.  Questions were answered to the patient's satisfaction.     [name] Shela Commons

## 2012-06-09 NOTE — Anesthesia Postprocedure Evaluation (Signed)
  Anesthesia Post-op Note  Patient: Brittany Stanton  Procedure(s) Performed: Procedure(s): LAPAROSCOPIC CHOLECYSTECTOMY WITH INTRAOPERATIVE CHOLANGIOGRAM (N/A)  Patient Location: PACU  Anesthesia Type:General  Level of Consciousness: awake and alert   Airway and Oxygen Therapy: Patient Spontanous Breathing  Post-op Pain: mild  Post-op Assessment: Post-op Vital signs reviewed, Patient's Cardiovascular Status Stable, Respiratory Function Stable, Patent Airway and No signs of Nausea or vomiting  Post-op Vital Signs: Reviewed and stable  Complications: No apparent anesthesia complications

## 2012-06-09 NOTE — Op Note (Signed)
Preoperative diagnosis:  Biliary dyskinesia  Postoperative diagnosis: Same  Procedure: Laparoscopic cholecystectomy with cholangiogram.  Surgeon: Avel Peace, M.D.  Asst.:  Claud Kelp, M.D.  Anesthesia: General  Indication:   This is a 52 year old female with biliary colic-type right upper quadrant pains after eating fatty or spicy meals. Abdominal ultrasound demonstrated no gallstones. However, a nuclear medicine hepatobiliary scan was consistent with biliary dyskinesia. Upper endoscopy was unremarkable. She now presents for elective cholecystectomy.  Technique: She was brought to the operating room, placed supine on the operating table, and a general anesthetic was administered. The hair on the abdominal wall was clipped as was necessary. The abdominal wall was then sterilely prepped and draped. Local anesthetic (Marcaine) was infiltrated in the subumbilical region. A small right upper quadrant incision was made through the skin.  Using a 5 mm Optiview trocar and laparoscope access was gained to the peritoneal cavity and a pneumoperitoneum was created. The area under the trocar was inspected and there was no evidence of bleeding or organ injury.  Three more trocars were then placed into the abdominal cavity under laparoscopic vision. One 5 mm in the epigastric area, and one 5 mm in the right upper quadrant area, and an 11 mm trocar in the subumbilical area through her previous lower midline scar. The gallbladder was visualized and the fundus was grasped and retracted toward the right shoulder.  No acute inflammatory changes were noted.  The infundibulum was mobilized with dissection close to the gallbladder and retracted laterally. The cystic duct was identified and a window was created around it. The cystic artery was also identified and a window was created around it. The critical view was achieved. A clip was placed at the neck of the gallbladder. A small incision was made in the cystic  duct. A cholangiocatheter was introduced through the anterior abdominal wall and placed in the cystic duct. A intraoperative cholangiogram was then performed.  Under real-time fluoroscopy, dilute contrast was injected into the cystic duct.  The common hepatic duct, the right and left hepatic ducts, and the common duct were all visualized. Contrast drained into the duodenum without obvious evidence of any obstructing ductal lesion. The final report is pending the Radiologist's interpretation.  The cholangiocatheter was removed, the cystic duct was clipped 3 times on the biliary side, and then the cystic duct was divided sharply. No bile leak was noted from the cystic duct stump.  The cystic artery was then clipped and divided. Following this the gallbladder was dissected free from the liver using electrocautery. A small puncture was made in the gallbladder with leakage of some bile.The gallbladder was then placed in a retrieval bag and removed from the abdominal cavity through the subumbilical incision.  The gallbladder fossa was inspected, copiously irrigated, and bleeding was controlled with electrocautery. Inspection showed that hemostasis was adequate and there was no evidence of bile leak.  The irrigation fluid was evacuated as much as possible.  The trocars were removed and the pneumoperitoneum was released. The skin incisions were closed with 4-0 Monocryl subcuticular stitches. Steri-Strips and sterile dressings were applied.  The procedure was well-tolerated without any apparent complications. She was taken to the recovery room in satisfactory condition.

## 2012-06-09 NOTE — Transfer of Care (Signed)
Immediate Anesthesia Transfer of Care Note  Patient: Brittany Stanton  Procedure(s) Performed: Procedure(s): LAPAROSCOPIC CHOLECYSTECTOMY WITH INTRAOPERATIVE CHOLANGIOGRAM (N/A)  Patient Location: PACU  Anesthesia Type:General  Level of Consciousness: awake, alert  and oriented  Airway & Oxygen Therapy: Patient Spontanous Breathing  Post-op Assessment: Report given to PACU RN and Post -op Vital signs reviewed and stable  Post vital signs: Reviewed and stable  Complications: No apparent anesthesia complications

## 2012-06-09 NOTE — H&P (View-Only) (Signed)
Patient ID: Brittany Stanton, female   DOB: [birth year], 52 y.o.   MRN: [id number]  Chief Complaint  Patient presents with  . New Evaluation    Lap chole ?    HPI Brittany Stanton is a 52 y.o. female.   HPI  She is referred by Dr. Lina Sar because of gallbladder dysfunction.  For about a year and a half, she's had intermittent right upper quadrant sharp pains. This is particularly after eating a fatty or spicy meal. Recently after eating about anything sometimes. She does have some nausea these episodes. No certain fever or chills. Occasional diarrhea. She underwent a right upper quadrant ultrasound demonstrated a normal gallbladder. A nuclear medicine hepatobiliary scan demonstrated a decreased ejection fraction consistent with biliary dyskinesia. Upper endoscopy was unremarkable. She sent over here to discuss cholecystectomy.  Past Medical History  Diagnosis Date  . Abdominal pain, epigastric   . Dyspepsia and other specified disorders of function of stomach   . Other abnormal glucose   . Decreased white blood cell count   . Other alteration of consciousness   . Screening for lipoid disorders   . Other malaise and fatigue   . Allergic rhinitis, cause unspecified   . Irritable bowel syndrome   . Esophageal reflux   . Anemia   . Arthritis     Past Surgical History  Procedure Laterality Date  . Abdominal hysterectomy  2008  . Tubal ligation  1996  . Cesarean section  [id number]    x 2  . Cervix surgery      D/C    Family History  Problem Relation Age of Onset  . Hypertension Mother   . Irritable bowel syndrome Mother   . Thyroid disease Mother   . Diabetes Father   . Hypertension Father   . Hypertension Brother   . GER disease Brother   . Cancer Maternal Grandfather     oral  . Leukemia Maternal Grandfather   . Hypertension Brother   . GER disease Brother   . Colon cancer Maternal Uncle     Social History History  Substance Use Topics  . Smoking status: Never  Smoker   . Smokeless tobacco: Never Used  . Alcohol Use: No    Allergies  Allergen Reactions  . Amoxicillin     REACTION: rash  . Penicillins     REACTION: Rash    Current Outpatient Prescriptions  Medication Sig Dispense Refill  . esomeprazole (NEXIUM) 40 MG capsule Take 1 capsule (40 mg total) by mouth daily before breakfast.  30 capsule  11  . Probiotic Product (ALIGN) 4 MG CAPS Take 1 capsule by mouth daily.  14 capsule  0   No current facility-administered medications for this visit.    Review of Systems Review of Systems  Constitutional:       Night sweats.  Weight gain.  Menopausal.  HENT: Positive for sore throat.   Respiratory: Negative.   Cardiovascular: Negative.   Gastrointestinal: Positive for nausea and abdominal pain.  Genitourinary: Negative.   Neurological: Positive for weakness.  Hematological: Negative.     Blood pressure 118/70, pulse 80, temperature 98.3 F (36.8 C), resp. rate 18, height 5\' 2"  (1.575 m), weight 159 lb (72.122 kg).  Physical Exam Physical Exam  Constitutional: No distress.  Overweight.  HENT:  Head: Normocephalic and atraumatic.  Eyes: EOM are normal. No scleral icterus.  Wears glasses.  Neck: Neck supple.  Cardiovascular: Normal rate and regular rhythm.   Pulmonary/Chest:  Effort normal and breath sounds normal.  Abdominal: Soft. She exhibits no distension and no mass.  Lower midline and lower transverse scars. No incisional hernias.  Musculoskeletal: She exhibits no edema.  Lymphadenopathy:    She has no cervical adenopathy.  Neurological: She is alert.  Skin: Skin is warm and dry.  Psychiatric: She has a normal mood and affect. Her behavior is normal.    Data Reviewed Ultrasound report, hepatobiliary scan report, notes from Dr. Juanda Chance.  Assessment    Increasingly symptomatic biliary dyskinesia.     Plan    We discussed laparoscopic cholecystectomy and the success rate of about 80% for this condition. She is  interested in proceeding with the operation.  I have explained the procedure, risks, and aftercare of cholecystectomy.  Risks include but are not limited to bleeding, infection, wound problems, anesthesia, diarrhea, bile leak, injury to common bile duct/liver/intestine.  She seems to understand and agrees to proceed.         Dannia Snook J 05/12/2012, 10:04 AM

## 2012-06-09 NOTE — Progress Notes (Signed)
Small area pink drainage on guauze to far right dressing.

## 2012-06-09 NOTE — Preoperative (Signed)
Beta Blockers   Reason not to administer Beta Blockers:Not Applicable 

## 2012-06-09 NOTE — Anesthesia Preprocedure Evaluation (Addendum)
Anesthesia Evaluation  Patient identified by MRN, date of birth, ID band Patient awake    Reviewed: Allergy & Precautions, H&P , NPO status , Patient's Chart, lab work & pertinent test results  Airway Mallampati: I TM Distance: >3 FB Neck ROM: full    Dental  (+) Dental Advisory Given   Pulmonary  breath sounds clear to auscultation        Cardiovascular Rhythm:regular Rate:Normal     Neuro/Psych PSYCHIATRIC DISORDERS Depression    GI/Hepatic GERD-  Medicated,  Endo/Other  diabetes, Type 2Prediabetes  Renal/GU      Musculoskeletal   Abdominal   Peds  Hematology   Anesthesia Other Findings   Reproductive/Obstetrics                         Anesthesia Physical Anesthesia Plan  ASA: II  Anesthesia Plan: General   Post-op Pain Management:    Induction: Intravenous  Airway Management Planned: Oral ETT  Additional Equipment:   Intra-op Plan:   Post-operative Plan: Extubation in OR  Informed Consent: I have reviewed the patients History and Physical, chart, labs and discussed the procedure including the risks, benefits and alternatives for the proposed anesthesia with the patient or authorized representative who has indicated his/her understanding and acceptance.   Dental advisory given  Plan Discussed with: CRNA, Anesthesiologist and Surgeon  Anesthesia Plan Comments:        Anesthesia Quick Evaluation

## 2012-06-10 ENCOUNTER — Encounter (HOSPITAL_COMMUNITY): Payer: Self-pay | Admitting: General Surgery

## 2012-06-26 ENCOUNTER — Encounter (INDEPENDENT_AMBULATORY_CARE_PROVIDER_SITE_OTHER): Payer: Self-pay | Admitting: General Surgery

## 2012-06-26 ENCOUNTER — Ambulatory Visit (INDEPENDENT_AMBULATORY_CARE_PROVIDER_SITE_OTHER): Payer: PRIVATE HEALTH INSURANCE | Admitting: General Surgery

## 2012-06-26 VITALS — BP 118/68 | HR 76 | Temp 98.4°F | Resp 16 | Ht 62.0 in | Wt 155.8 lb

## 2012-06-26 DIAGNOSIS — Z9889 Other specified postprocedural states: Secondary | ICD-10-CM

## 2012-06-26 NOTE — Progress Notes (Signed)
Procedure:  Laparoscopic cholecystectomy with cholangiogram  Date:  06/09/2012  Pathology:  Benign  History:  She is here for her first postoperative visit and was doing well. She is feeling better than she did preoperatively.  Exam: General- Is in NAD. Abdomen-soft, incisions clean and intact.  Assessment:  Doing well postoperatively. Preoperative symptoms improved.  Plan: Lifelong low fat diet. Activities as tolerated. Return as needed.

## 2012-06-26 NOTE — Patient Instructions (Signed)
Low-fat diet as we discussed. Activities as tolerated.

## 2012-08-24 ENCOUNTER — Encounter: Payer: Self-pay | Admitting: Internal Medicine

## 2012-10-13 ENCOUNTER — Encounter (INDEPENDENT_AMBULATORY_CARE_PROVIDER_SITE_OTHER): Payer: Self-pay

## 2012-12-15 ENCOUNTER — Ambulatory Visit (INDEPENDENT_AMBULATORY_CARE_PROVIDER_SITE_OTHER): Payer: PRIVATE HEALTH INSURANCE | Admitting: Family Medicine

## 2012-12-15 ENCOUNTER — Ambulatory Visit: Payer: PRIVATE HEALTH INSURANCE | Admitting: Family Medicine

## 2012-12-15 ENCOUNTER — Encounter: Payer: Self-pay | Admitting: Family Medicine

## 2012-12-15 VITALS — BP 100/62 | HR 78 | Temp 98.1°F | Ht 62.0 in | Wt 145.2 lb

## 2012-12-15 DIAGNOSIS — R300 Dysuria: Secondary | ICD-10-CM

## 2012-12-15 DIAGNOSIS — R350 Frequency of micturition: Secondary | ICD-10-CM

## 2012-12-15 LAB — POCT URINALYSIS DIPSTICK
Bilirubin, UA: NEGATIVE
Blood, UA: NEGATIVE
Leukocytes, UA: NEGATIVE
Nitrite, UA: NEGATIVE
Protein, UA: NEGATIVE
pH, UA: 6

## 2012-12-15 NOTE — Assessment & Plan Note (Signed)
Urine appears clear...given very typical symtpoms will send for culture to verify no infection. ? resoving UTI or bladder irritaion causing spasm.  Avoid bladder irritants.  Push fluids.

## 2012-12-15 NOTE — Patient Instructions (Addendum)
Push fluids.  Avoid bladder irritants. We will call with culture results. If symptoms are not improving or if fever, vomiting or severe abdominal pain, please call to let us know.

## 2012-12-15 NOTE — Progress Notes (Signed)
   Subjective:    Patient ID: Brittany Stanton, female    DOB: 05-30-1960, 52 y.o.   MRN: [id number]  Urinary Tract Infection  This is a new problem. The current episode started in the past 7 days. The problem occurs every urination. The problem has been gradually worsening. The quality of the pain is described as burning. The pain is mild. There has been no fever. She is sexually active. There is no history of pyelonephritis. Associated symptoms include frequency, hesitancy and urgency. Pertinent negatives include no flank pain, hematuria, nausea, sweats or vomiting. Associated symptoms comments: Central Low abdominal pain. She has tried increased fluids (she has been using OTC cystex.) for the symptoms. The treatment provided moderate relief. There is no history of catheterization, kidney stones, recurrent UTIs, a single kidney, urinary stasis or a urological procedure.    She is not eating any acidic foods given hx of GERd, only has 1/2 cup coffee a day. No ETOH.   Review of Systems  Gastrointestinal: Negative for nausea and vomiting.  Genitourinary: Positive for hesitancy, urgency and frequency. Negative for hematuria and flank pain.       Objective:   Physical Exam  Constitutional: Vital signs are normal. She appears well-developed and well-nourished. She is cooperative.  Non-toxic appearance. She does not appear ill. No distress.  HENT:  Head: Normocephalic.  Right Ear: Hearing, tympanic membrane, external ear and ear canal normal. Tympanic membrane is not erythematous, not retracted and not bulging.  Left Ear: Hearing, tympanic membrane, external ear and ear canal normal. Tympanic membrane is not erythematous, not retracted and not bulging.  Nose: No mucosal edema or rhinorrhea. Right sinus exhibits no maxillary sinus tenderness and no frontal sinus tenderness. Left sinus exhibits no maxillary sinus tenderness and no frontal sinus tenderness.  Mouth/Throat: Uvula is midline,  oropharynx is clear and moist and mucous membranes are normal.  Eyes: Conjunctivae, EOM and lids are normal. Pupils are equal, round, and reactive to light. Lids are everted and swept, no foreign bodies found.  Neck: Trachea normal and normal range of motion. Neck supple. Carotid bruit is not present. No mass and no thyromegaly present.  Cardiovascular: Normal rate, regular rhythm, S1 normal, S2 normal, normal heart sounds, intact distal pulses and normal pulses.  Exam reveals no gallop and no friction rub.   No murmur heard. Pulmonary/Chest: Effort normal and breath sounds normal. Not tachypneic. No respiratory distress. She has no decreased breath sounds. She has no wheezes. She has no rhonchi. She has no rales.  Abdominal: Soft. Normal appearance and bowel sounds are normal. There is no tenderness. There is no CVA tenderness.  Neurological: She is alert.  Skin: Skin is warm, dry and intact. No rash noted.  Psychiatric: Her speech is normal and behavior is normal. Judgment and thought content normal. Her mood appears not anxious. Cognition and memory are normal. She does not exhibit a depressed mood.          Assessment & Plan:

## 2012-12-15 NOTE — Addendum Note (Signed)
Addended by: Damita Lack on: 12/15/2012 02:48 PM   Modules accepted: Orders

## 2012-12-15 NOTE — Progress Notes (Signed)
Pre-visit discussion using our clinic review tool. No additional management support is needed unless otherwise documented below in the visit note.  

## 2012-12-16 LAB — URINE CULTURE

## 2013-06-06 ENCOUNTER — Encounter: Payer: Self-pay | Admitting: Internal Medicine

## 2013-06-16 ENCOUNTER — Other Ambulatory Visit (HOSPITAL_COMMUNITY): Payer: Self-pay | Admitting: Obstetrics and Gynecology

## 2013-06-16 DIAGNOSIS — Z1231 Encounter for screening mammogram for malignant neoplasm of breast: Secondary | ICD-10-CM

## 2013-06-18 ENCOUNTER — Ambulatory Visit (HOSPITAL_COMMUNITY)
Admission: RE | Admit: 2013-06-18 | Discharge: 2013-06-18 | Disposition: A | Discharge status: Home / Self Care | Payer: PRIVATE HEALTH INSURANCE | Source: Ambulatory Visit | Attending: Obstetrics and Gynecology | Admitting: Obstetrics and Gynecology

## 2013-06-18 DIAGNOSIS — Z1231 Encounter for screening mammogram for malignant neoplasm of breast: Secondary | ICD-10-CM

## 2013-07-06 ENCOUNTER — Ambulatory Visit (INDEPENDENT_AMBULATORY_CARE_PROVIDER_SITE_OTHER): Payer: PRIVATE HEALTH INSURANCE | Admitting: Internal Medicine

## 2013-07-06 ENCOUNTER — Encounter: Payer: Self-pay | Admitting: Internal Medicine

## 2013-07-06 VITALS — BP 100/70 | HR 60 | Ht 62.0 in | Wt 138.6 lb

## 2013-07-06 DIAGNOSIS — R1013 Epigastric pain: Secondary | ICD-10-CM

## 2013-07-06 MED ORDER — ESOMEPRAZOLE MAGNESIUM 40 MG PO CPDR: 40.0000 mg | DELAYED_RELEASE_CAPSULE | Freq: Every day | ORAL | Status: DC

## 2013-07-06 NOTE — Progress Notes (Signed)
Brittany LenisWendy D Stanton 02-Jan-1961 [id number]  Note: This dictation was prepared with Dragon digital system. Any transcriptional errors that result from this procedure are unintentional.   History of Present Illness:  This is a 53 year old African American female with an acute onset of epigastric pain while out of town at a conference. She did not take Nexium with her. We saw her one year ago for abdominal pain localized to the right upper quadrant. Although her ultrasound was negative, her HIDA scan was abnormal and she had pain with CCK infusion. Her liver function tests were abnormal as well. She underwent a laparoscopic cholecystectomy in May 2014 and has done well until last week when she developed epigastric pain. An upper endoscopy in April 2014 was essentially normal with negative CLOtest. She started taking Nexium again last week. She is much better at this point. Her mother died of pancreatic cancer and patient is concerned about the possibility of pancreatitis. She does not drink any alcohol.    Past Medical History  Diagnosis Date  . Abdominal pain, epigastric   . Dyspepsia and other specified disorders of function of stomach   . Other abnormal glucose   . Decreased white blood cell count   . Other alteration of consciousness   . Screening for lipoid disorders   . Other malaise and fatigue   . Allergic rhinitis, cause unspecified   . Irritable bowel syndrome   . Esophageal reflux   . Diabetes     told that she is pre diabetic   . Arthritis     knees, R hip, jaw- R   . Anemia   . 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 Fatty liver     Past Surgical History  Procedure Laterality Date  . Abdominal hysterectomy  2008  . Tubal ligation  1996  . Cesarean section  [id number]    x 2  . Cervix surgery       for fibroid tumors  . Cholecystectomy N/A 06/09/2012    Procedure: LAPAROSCOPIC CHOLECYSTECTOMY WITH INTRAOPERATIVE CHOLANGIOGRAM;   Surgeon: Adolph Pollackodd J Rosenbower, MD;  [facility];  Service: General;  Laterality: N/A;    Allergies  Allergen Reactions  . Amoxicillin     REACTION: rash  . Penicillins Hives    REACTION: Rash    Family history and social history have been reviewed.  Review of Systems:   The remainder of the 10 point ROS is negative except as outlined in the H&P  Physical Exam: General Appearance Well developed, in no distress Eyes  Non icteric  HEENT  Non traumatic, normocephalic  Mouth No lesion, tongue papillated, no cheilosis Neck Supple without adenopathy, thyroid not enlarged, no carotid bruits, no JVD Lungs Clear to auscultation bilaterally COR Normal S1, normal S2, regular rhythm, no murmur, quiet precordium Abdomen minimal discomfort in the epigastrium. Normoactive bowel sounds. No distention. Liver edge at costal margin Rectal not done Extremities  No pedal edema Skin No lesions Neurological Alert and oriented x 3 Psychological Normal mood and affect  Assessment and Plan:   Problem #1 Recurrent epigastric discomfort likely secondary to gastritis. She also has dyspepsia. She is getting better after resuming Nexium 40 mg daily. She will need refills on this medication. She will take it every day for 1 month then will decrease to a maintenance dose of several times a week as needed.  Problem #2 Colorectal screening. Her last colonoscopy was in August 2009.  She has a positive family history of colon cancer in her maternal uncle. She is due for a recall colonoscopy in August 2016.    Brittany Stanton 07/06/2013

## 2013-07-06 NOTE — Patient Instructions (Signed)
We have sent the following medications to your pharmacy for you to pick up at your convenience: Nexium 40 mg daily  CC:Dr Jerl MinaJames Hedrick

## 2013-07-07 ENCOUNTER — Encounter: Payer: Self-pay | Admitting: Internal Medicine

## 2014-07-26 ENCOUNTER — Encounter: Payer: Self-pay | Admitting: Internal Medicine

## 2014-07-26 ENCOUNTER — Ambulatory Visit (INDEPENDENT_AMBULATORY_CARE_PROVIDER_SITE_OTHER): Payer: PRIVATE HEALTH INSURANCE | Admitting: Internal Medicine

## 2014-07-26 VITALS — BP 105/68 | HR 73 | Ht 62.0 in | Wt 157.2 lb

## 2014-07-26 DIAGNOSIS — L290 Pruritus ani: Secondary | ICD-10-CM

## 2014-07-26 DIAGNOSIS — Z800 Family history of malignant neoplasm of digestive organs: Secondary | ICD-10-CM | POA: Diagnosis not present

## 2014-07-26 DIAGNOSIS — R1013 Epigastric pain: Secondary | ICD-10-CM | POA: Diagnosis not present

## 2014-07-26 MED ORDER — NA SULFATE-K SULFATE-MG SULF 17.5-3.13-1.6 GM/177ML PO SOLN: ORAL | Status: DC

## 2014-07-26 MED ORDER — ESOMEPRAZOLE MAGNESIUM 40 MG PO CPDR: 40.0000 mg | DELAYED_RELEASE_CAPSULE | Freq: Every day | ORAL | Status: AC

## 2014-07-26 NOTE — Patient Instructions (Addendum)
We have sent medications to your pharmacy for you to pick up at your convenience.  You have been scheduled for a colonoscopy. Please follow written instructions given to you at your visit today.  Please pick up your prep supplies at the pharmacy within the next 1-3 days. If you use inhalers (even only as needed), please bring them with you on the day of your procedure. Your physician has requested that you go to www.startemmi.com and enter the access code given to you at your visit today. This web site gives a general overview about your procedure. However, you should still follow specific instructions given to you by our office regarding your preparation for the procedure.   Dr Jerl MinaJames Hedrick

## 2014-07-26 NOTE — Progress Notes (Signed)
Arnoldo LenisWendy D Guzek 03/18/1960 [id number]  Note: This dictation was prepared with Dragon digital system. Any transcriptional errors that result from this procedure are unintentional.   History of Present Illness: This is a 54 year old white female seen for epigastric pain, last office visit in June 2015. She is on Nexium 40 mg about 3 times a week which is increased to every day  when she has a flareup. Positive family history of colon cancer in a maternal uncle and she is due for colonoscopy this year. Last colonoscopy in September 2009 normal. She has had normal bowel habits but occasionally rectal pain and itching she denies rectal bleeding. There is a prior history of a cholecystectomy in May 2014 she had a normal upper endoscopy in April 2014    Past Medical History  Diagnosis Date  . Abdominal pain, epigastric   . Dyspepsia and other specified disorders of function of stomach   . Other abnormal glucose   . Decreased white blood cell count   . Other alteration of consciousness   . Screening for lipoid disorders   . Other malaise and fatigue   . Allergic rhinitis, cause unspecified   . Irritable bowel syndrome   . Esophageal reflux   . Diabetes     told that she is pre diabetic   . Arthritis     knees, R hip, jaw- R   . Anemia   . 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 Fatty liver     Past Surgical History  Procedure Laterality Date  . Abdominal hysterectomy  2008  . Tubal ligation  1996  . Cesarean section  [id number]    x 2  . Cervix surgery       for fibroid tumors  . Cholecystectomy N/A 06/09/2012    Procedure: LAPAROSCOPIC CHOLECYSTECTOMY WITH INTRAOPERATIVE CHOLANGIOGRAM;  Surgeon: Adolph Pollackodd J Rosenbower, MD;  [facility];  Service: General;  Laterality: N/A;    Allergies  Allergen Reactions  . Amoxicillin     REACTION: rash  . Penicillins Hives    REACTION: Rash    Family history and social history have been  reviewed.  Review of Systems: Denies rectal bleeding. Intermittent rectal pain  The remainder of the 10 point ROS is negative except as outlined in the H&P  Physical Exam: General Appearance Well developed, in no distress Eyes  Non icteric  HEENT  Non traumatic, normocephalic  Mouth No lesion, tongue papillated, no cheilosis Neck Supple without adenopathy, thyroid not enlarged, no carotid bruits, no JVD Lungs Clear to auscultation bilaterally COR Normal S1, normal S2, regular rhythm, no murmur, quiet precordium Abdomen mildly protuberant. Soft. Liver edge 2 cm below right costal margin. Postcholecystectomy scars. Normoactive bowel sounds Rectal not done, deferred to colonoscopy Extremities  No pedal edema Skin No lesions Neurological Alert and oriented x 3 Psychological Normal mood and affect  Assessment and Plan:   54 year old African-American female with the positive family history of colon cancer in a indirect relatives. She has been having some rectal symptoms which are nonspecific. She will be due for colonoscopy in September but her insurance will be changing as of August 1 and would like to have the colonoscopy before that. We will go ahead and schedule her for colonoscopy.  Epigastric pain. Currently well controlled on Nexium 40 mg. We will refill    Lina SarDora Uldine Fuster 07/26/2014

## 2014-08-10 ENCOUNTER — Encounter: Payer: Self-pay | Admitting: Gastroenterology

## 2014-08-10 ENCOUNTER — Telehealth: Payer: Self-pay | Admitting: Internal Medicine

## 2014-08-10 ENCOUNTER — Encounter: Payer: PRIVATE HEALTH INSURANCE | Admitting: Internal Medicine

## 2014-08-11 NOTE — Telephone Encounter (Signed)
Instructions mailed. Confirmed with the patient that she has the prep.

## 2014-11-11 ENCOUNTER — Ambulatory Visit (AMBULATORY_SURGERY_CENTER): Payer: BLUE CROSS/BLUE SHIELD | Admitting: Gastroenterology

## 2014-11-11 ENCOUNTER — Encounter: Payer: Self-pay | Admitting: Gastroenterology

## 2014-11-11 VITALS — BP 111/71 | HR 66 | Temp 96.6°F | Resp 47 | Ht 62.0 in | Wt 157.0 lb

## 2014-11-11 DIAGNOSIS — Z1211 Encounter for screening for malignant neoplasm of colon: Secondary | ICD-10-CM | POA: Diagnosis not present

## 2014-11-11 DIAGNOSIS — Z800 Family history of malignant neoplasm of digestive organs: Secondary | ICD-10-CM | POA: Diagnosis not present

## 2014-11-11 MED ORDER — SODIUM CHLORIDE 0.9 % IV SOLN: 500.0000 mL | INTRAVENOUS | Status: DC

## 2014-11-11 NOTE — Progress Notes (Signed)
To recovery, report to Smith, RN, VSS 

## 2014-11-11 NOTE — Patient Instructions (Signed)
YOU HAD AN ENDOSCOPIC PROCEDURE TODAY AT THE [facility]:   Refer to the procedure report that was given to you for any specific questions about what was found during the examination.  If the procedure report does not answer your questions, please call your gastroenterologist to clarify.  If you requested that your care partner not be given the details of your procedure findings, then the procedure report has been included in a sealed envelope for you to review at your convenience later.  YOU SHOULD EXPECT: Some feelings of bloating in the abdomen. Passage of more gas than usual.  Walking can help get rid of the air that was put into your GI tract during the procedure and reduce the bloating. If you had a lower endoscopy (such as a colonoscopy or flexible sigmoidoscopy) you may notice spotting of blood in your stool or on the toilet paper. If you underwent a bowel prep for your procedure, you may not have a normal bowel movement for a few days.  Please Note:  You might notice some irritation and congestion in your nose or some drainage.  This is from the oxygen used during your procedure.  There is no need for concern and it should clear up in a day or so.  SYMPTOMS TO REPORT IMMEDIATELY:   Following lower endoscopy (colonoscopy or flexible sigmoidoscopy):  Excessive amounts of blood in the stool  Significant tenderness or worsening of abdominal pains  Swelling of the abdomen that is new, acute  Fever of 100F or higher   For urgent or emergent issues, a gastroenterologist can be reached at any hour by calling [phone].   DIET: Your first meal following the procedure should be a small meal and then it is ok to progress to your normal diet. Heavy or fried foods are harder to digest and may make you feel nauseous or bloated.  Likewise, meals heavy in dairy and vegetables can increase bloating.  Drink plenty of fluids but you should avoid alcoholic beverages for 24  hours.  ACTIVITY:  You should plan to take it easy for the rest of today and you should NOT DRIVE or use heavy machinery until tomorrow (because of the sedation medicines used during the test).    FOLLOW UP: Our staff will call the number listed on your records the next business day following your procedure to check on you and address any questions or concerns that you may have regarding the information given to you following your procedure. If we do not reach you, we will leave a message.  However, if you are feeling well and you are not experiencing any problems, there is no need to return our call.  We will assume that you have returned to your regular daily activities without incident.  If any biopsies were taken you will be contacted by phone or by letter within the next 1-3 weeks.  Please call us at [phone] if you have not heard about the biopsies in 3 weeks.    SIGNATURES/CONFIDENTIALITY: You and/or your care partner have signed paperwork which will be entered into your electronic medical record.  These signatures attest to the fact that that the information above on your After Visit Summary has been reviewed and is understood.  Full responsibility of the confidentiality of this discharge information lies with you and/or your care-partner. 

## 2014-11-11 NOTE — Op Note (Signed)
[facility] [address] [place], [id number]   COLONOSCOPY PROCEDURE REPORT  PATIENT: Brittany Stanton, Brittany Stanton  MR#: [id number] BIRTHDATE: 1960-02-21 , 54  yrs. old GENDER: female ENDOSCOPIST: Marsa ArisKavitha Alexei Doswell, MD REFERRED FA:OZHYQBY:James Hedrick, M.Stanton. PROCEDURE DATE:  11/11/2014 PROCEDURE:   Colonoscopy, screening First Screening Colonoscopy - Avg.  risk and is 50 yrs.  old or older - No.  Prior Negative Screening - Now for repeat screening. Less than 10 yrs Prior Negative Screening - Now for repeat screening.  Other: See Comments  History of Adenoma - Now for follow-up colonoscopy & has been > or = to 3 yrs.  N/A  Polyps removed today? No Recommend repeat exam, <10 yrs? Yes high risk ASA CLASS:   Class II INDICATIONS:Screening for colonic neoplasia and FH Colon or Rectal Adenocarcinoma. MEDICATIONS: Propofol 280 IV  DESCRIPTION OF PROCEDURE:   After the risks benefits and alternatives of the procedure were thoroughly explained, informed consent was obtained.  The digital rectal exam revealed no abnormalities of the rectum.   The LB PFC-H190 [id number]  endoscope was introduced through the anus and advanced to the terminal ileum which was intubated for a short distance. No adverse events experienced.   The quality of the prep was good.  The instrument was then slowly withdrawn as the colon was fully examined. Estimated blood loss is zero unless otherwise noted in this procedure report.   COLON FINDINGS: A normal appearing cecum, ileocecal valve, and appendiceal orifice were identified.  The ascending, transverse, descending, sigmoid colon, and rectum appeared unremarkable.   The examined terminal ileum appeared to be normal.   Small internal hemorrhoids were found.  Retroflexed views revealed internal hemorrhoids. The time to cecum = 15.3 Withdrawal time = 15.0   The scope was withdrawn and the procedure completed. COMPLICATIONS: There were no immediate  complications.  ENDOSCOPIC IMPRESSION: 1.   Normal colonoscopy 2.   The examined terminal ileum appeared to be normal 3.   Small internal hemorrhoids  RECOMMENDATIONS: 1.  Given your significant family history of colon cancer, you should have a repeat colonoscopy in 5 years   eSigned:  Marsa ArisKavitha Gael Londo, MD 11/11/2014 10:08 AM

## 2014-11-14 ENCOUNTER — Telehealth: Payer: Self-pay | Admitting: *Deleted

## 2014-11-14 NOTE — Telephone Encounter (Signed)
  Follow up Call-  Call back number 11/11/2014 04/14/2012  Post procedure Call Back phone  # [phone]  Permission to leave phone message Yes Yes     Patient questions:  Do you have a fever, pain , or abdominal swelling? No. Pain Score  0 *  Have you tolerated food without any problems? Yes.    Have you been able to return to your normal activities? Yes.    Do you have any questions about your discharge instructions: Diet   No. Medications  No. Follow up visit  No.  Do you have questions or concerns about your Care? No.  Actions: * If pain score is 4 or above: No action needed, pain <4.

## 2014-11-21 ENCOUNTER — Telehealth: Payer: Self-pay | Admitting: Gastroenterology

## 2014-11-21 NOTE — Telephone Encounter (Signed)
I see a dx of IBS on list and hx of using dicyclomine We may be able to works something out over the phone for starters  1) If diarrhea is watery and 3+ times a day then needs C diff PCR 2) Ask if she has experienced something like this before - note I do not think it is likely to be a problem from the colonoscopy 3) if she thinks dicyclomine has helped in past would Rx 20 mg qAC prn or q 6 hrs prn # 90 no RF 4) Pending this we can decide on f/u appt

## 2014-11-21 NOTE — Telephone Encounter (Signed)
Doc of the Day Former patient of DB. Had a normal colonoscopy on 11/11/14. She call with a complaint of right mid abdominal discomfort that started on 11/19/14. She says it is intermittent, burning and tender if she pushes on the area. She is experiencing diarrhea. Denies fever or blood in the stool. There are no appointment openings with APP until 12/12/14 and Nindigam is booked until January 2017. Should I send the patient to her PCP?

## 2014-11-22 ENCOUNTER — Other Ambulatory Visit: Payer: Self-pay

## 2014-11-22 MED ORDER — DICYCLOMINE HCL 20 MG PO TABS: 20.0000 mg | ORAL_TABLET | Freq: Four times a day (QID) | ORAL | Status: DC

## 2014-11-22 NOTE — Telephone Encounter (Signed)
Spoke with the patient yesterday. Did not call medication in until today. Call to the patient to advise her. She says she will pick it up today.

## 2014-12-13 ENCOUNTER — Other Ambulatory Visit: Payer: Self-pay | Admitting: Internal Medicine

## 2015-01-05 IMAGING — RF DG CHOLANGIOGRAM OPERATIVE
1 series · 5 of 5 positions shown · non-contrast
Comparison: None.

CLINICAL DATA: Cholelithiasis

INTRAOPERATIVE CHOLANGIOGRAM
TECHNIQUE: Multiple fluoroscopic spot radiographs were obtained
during intraoperative cholangiogram and are submitted for
interpretation post-operatively. 10 seconds of fluoroscopy was
utilized.

[Series 1: run · 2 acquisitions, 5 frames shown]
[im 1/2]
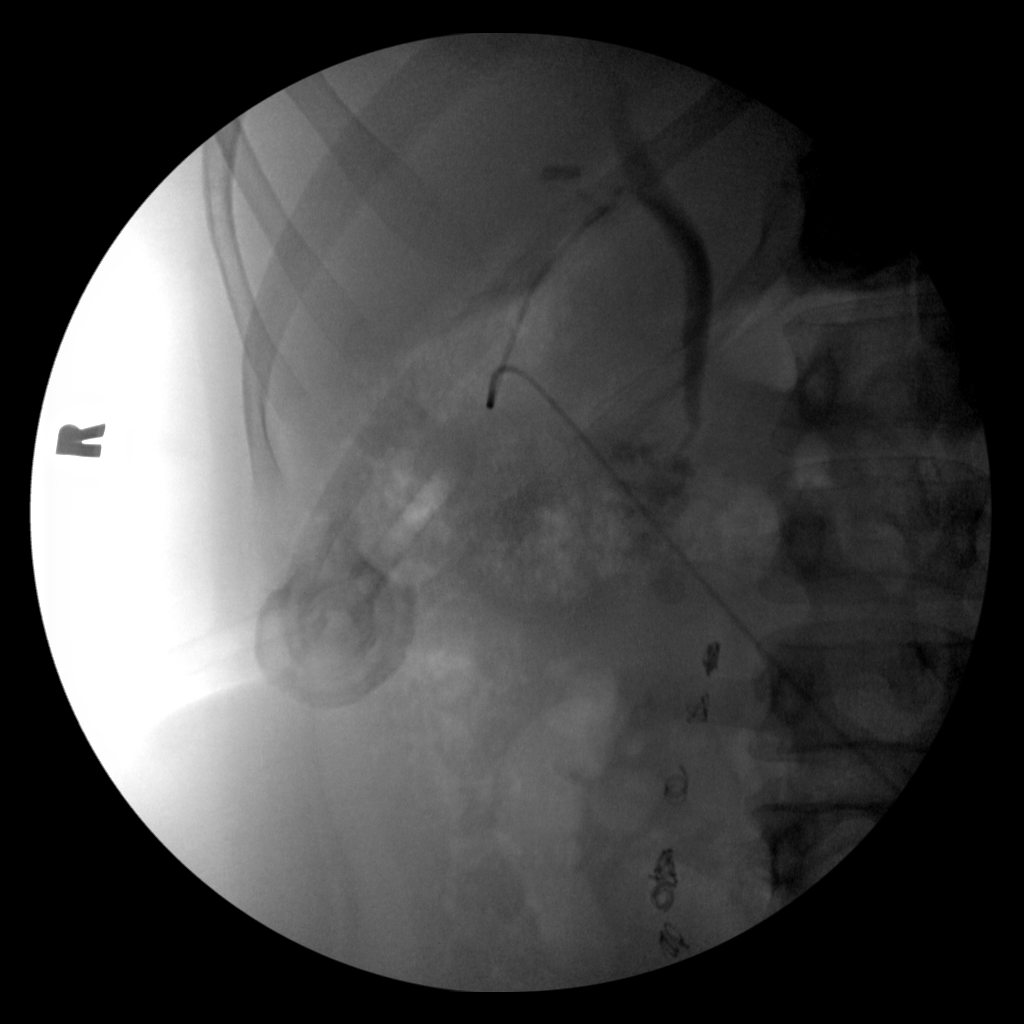
[im 1/2]
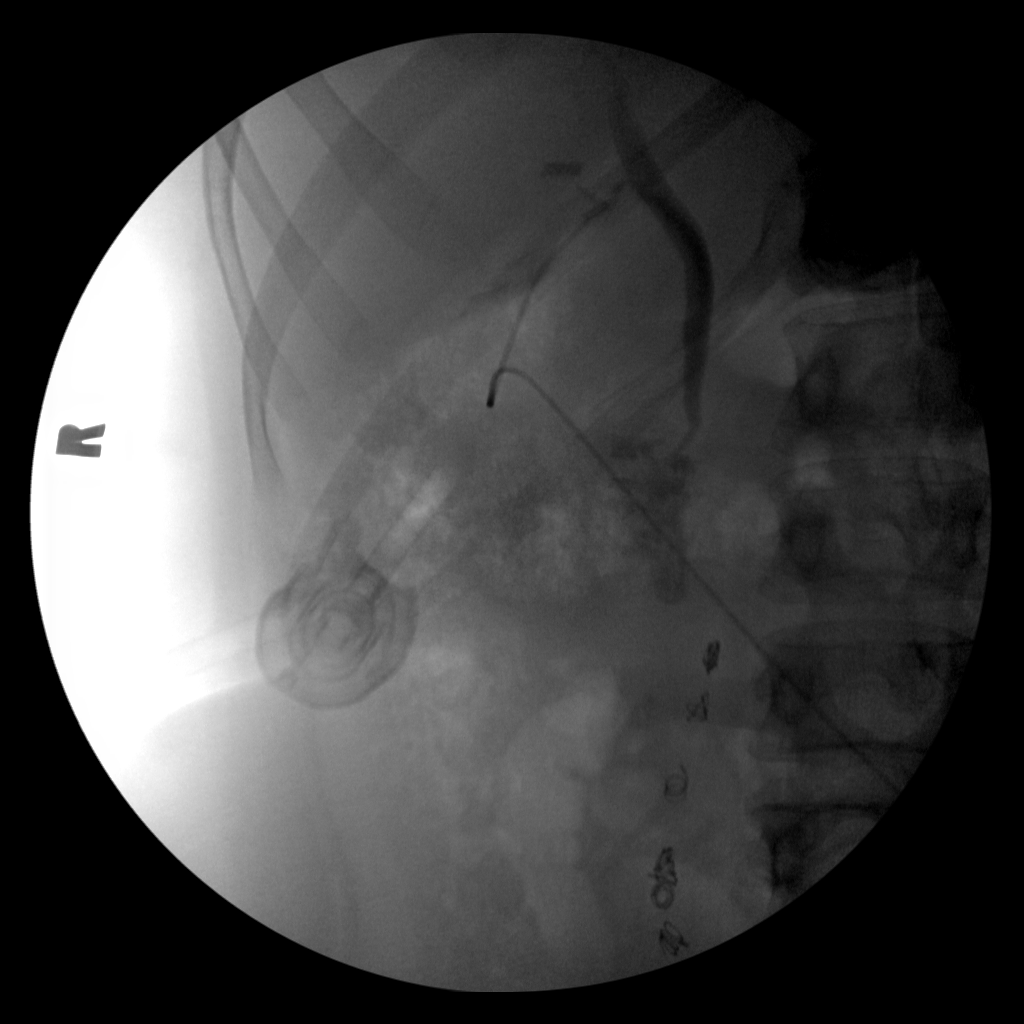
[im 1/2]
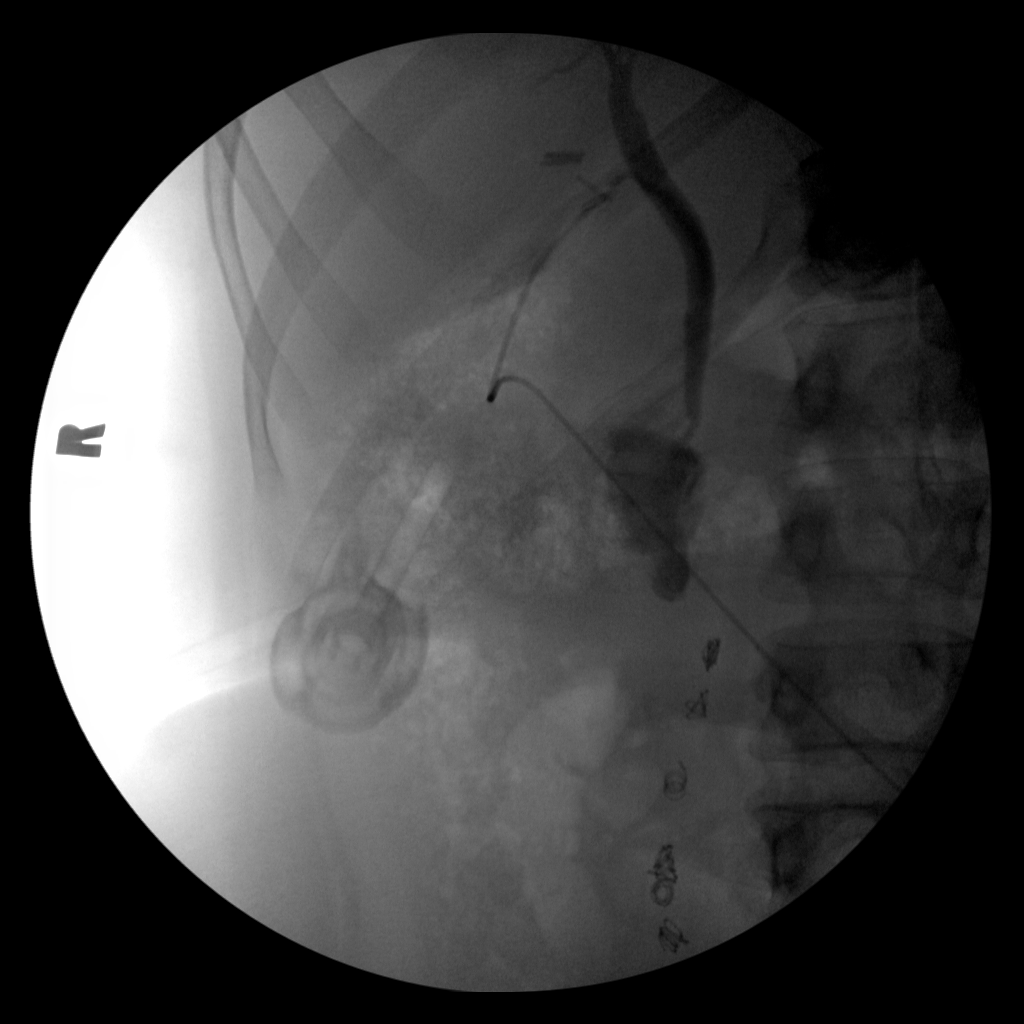
[im 1/2]
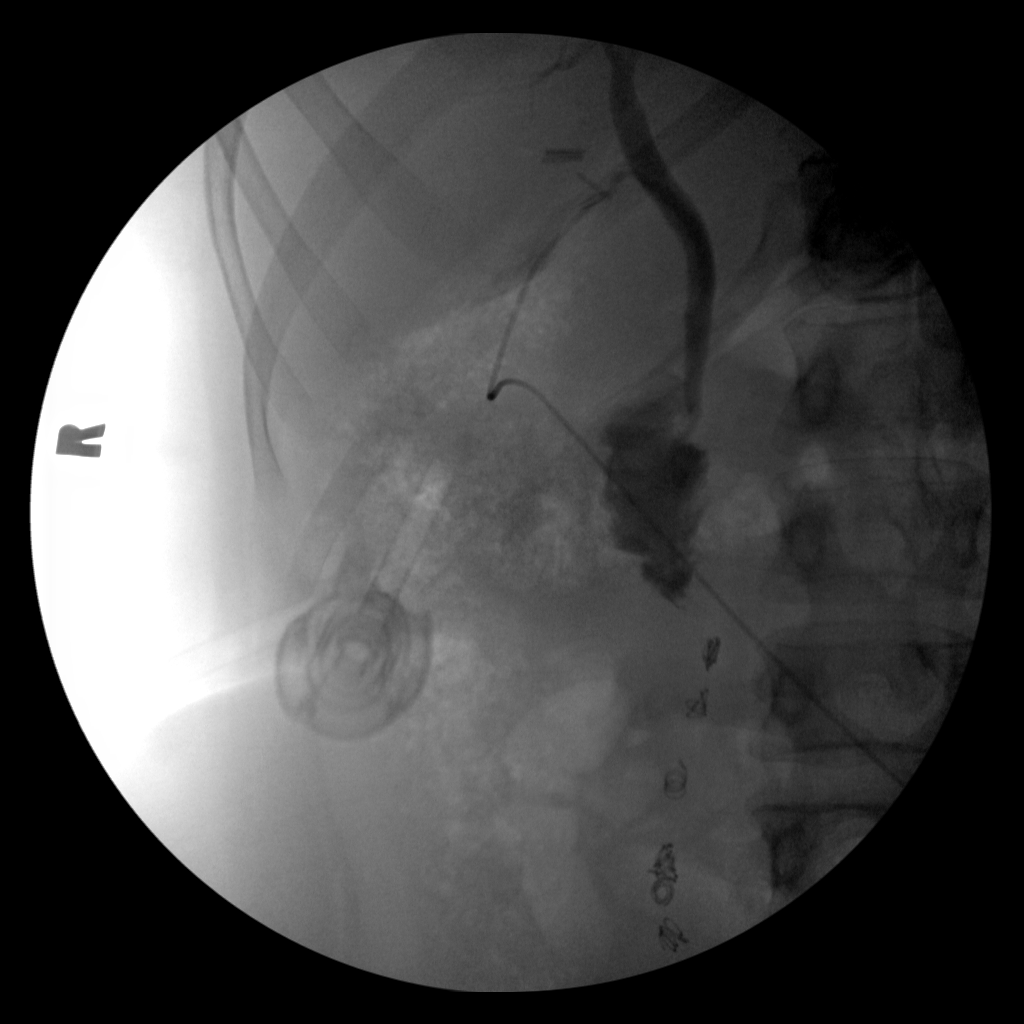
[im 2/2]
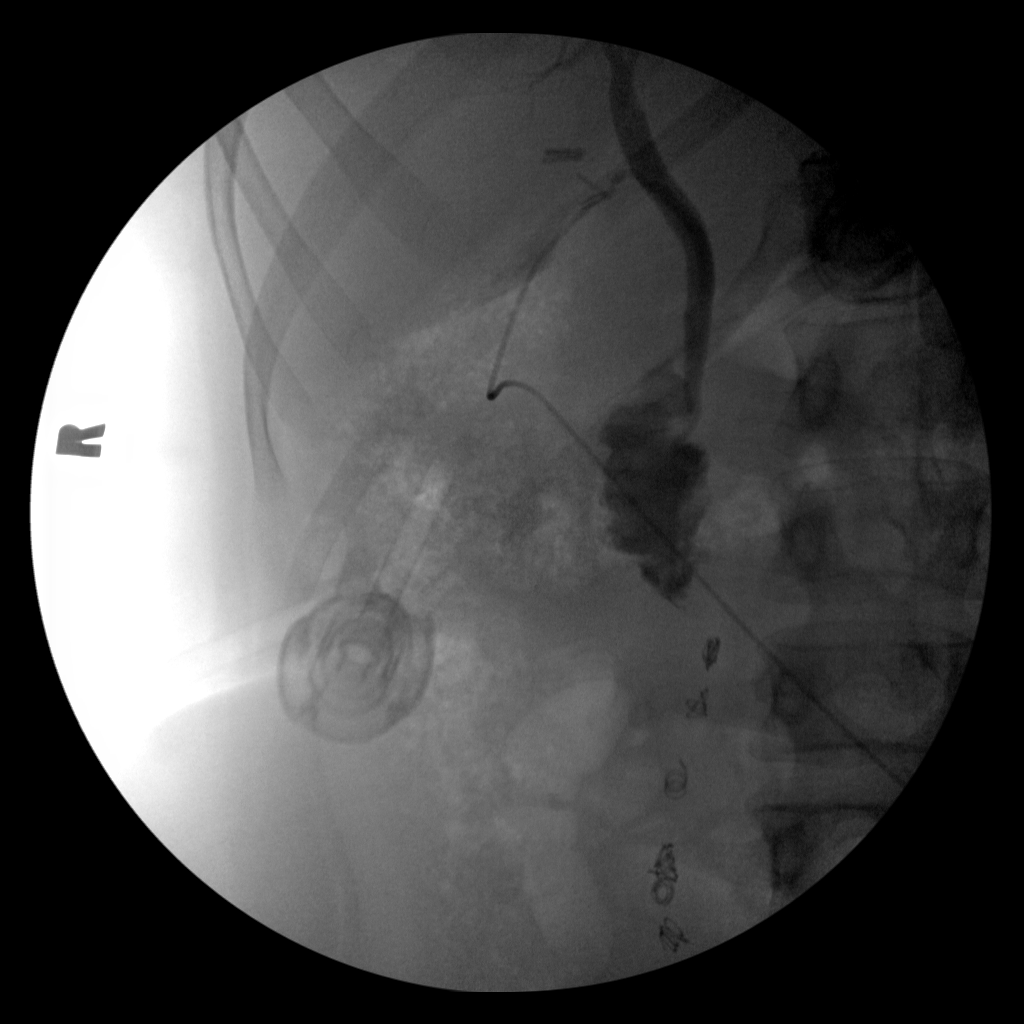

[5 of 5 positions shown; findings below may reference images not displayed]

FINDINGS: There is free flow contrast material into the common bile
duct.  Free flow of contrast in the duodenum is seen.  No filling
defects are noted.  There are changes consistent with prior ovarian
vein embolization.
IMPRESSION: No evidence of retained calculi.

## 2015-03-29 ENCOUNTER — Telehealth: Payer: Self-pay | Admitting: Gastroenterology

## 2015-03-29 NOTE — Telephone Encounter (Signed)
She has started experiencing daytime breakthrough GERD symptoms. She is continuous on taking the Nexium. No missed doses. Her symptoms are of belching and refluxing. This is occurs with foods like salads or peanut butter. She does not eat large meals or late night. She has not tried any OTC treatments. She wants to try Pepcid Complete or Tums first. She will avoid foods that she knows to cause her symptoms. If she acutely worsens or fails to improve after at least a week, she will call back to us.

## 2015-03-29 NOTE — Telephone Encounter (Signed)
Avoid high-fiber and high fat diet. Okay to try Pepcid or Tums as needed for breakthrough symptoms. But if she continues to have significant persistent heartburn, can consider increasing Nexium to twice daily, 30 minutes before breakfast and dinner

## 2015-03-31 ENCOUNTER — Encounter: Payer: Self-pay | Admitting: Internal Medicine

## 2015-10-03 ENCOUNTER — Other Ambulatory Visit: Payer: Self-pay

## 2015-10-03 ENCOUNTER — Telehealth: Payer: Self-pay | Admitting: Gastroenterology

## 2015-10-03 MED ORDER — HYDROCORTISONE ACETATE 25 MG RE SUPP: 25.0000 mg | Freq: Every day | RECTAL | 0 refills | Status: DC

## 2015-10-03 NOTE — Telephone Encounter (Signed)
Ok to send Anusol suppository at bedtime as needed. Please schedule patient for hemorrhoidal band ligation. Thanks

## 2015-10-03 NOTE — Telephone Encounter (Signed)
Patient is in agreement with this plan. Information mailed to her with an appointment on 11/27/15

## 2015-10-03 NOTE — Telephone Encounter (Signed)
Patient has had a couple of weeks of an itching burning of the rectum. She is using wet wipe. Keeping stools soft, avoiding constipation. OTC suppositories helped but she says the symptoms came back. She had a little blood on the wipe this morning. Can she have Anusol HC suppositories.

## 2015-11-27 ENCOUNTER — Ambulatory Visit (INDEPENDENT_AMBULATORY_CARE_PROVIDER_SITE_OTHER): Payer: BLUE CROSS/BLUE SHIELD | Admitting: Gastroenterology

## 2015-11-27 ENCOUNTER — Encounter: Payer: Self-pay | Admitting: Gastroenterology

## 2015-11-27 VITALS — BP 106/68 | HR 64 | Ht 62.0 in | Wt 142.8 lb

## 2015-11-27 DIAGNOSIS — K641 Second degree hemorrhoids: Secondary | ICD-10-CM

## 2015-11-27 NOTE — Patient Instructions (Signed)

## 2015-11-27 NOTE — Progress Notes (Signed)
PROCEDURE NOTE: The patient presents with symptomatic grade II  hemorrhoids, requesting rubber band ligation of his/her hemorrhoidal disease.  All risks, benefits and alternative forms of therapy were described and informed consent was obtained.   The anorectum was pre-medicated with 0.125% Nitroglycerine The decision was made to band the R anterior internal hemorrhoid, and the [facility] O'Regan System was used to perform band ligation without complication.  Digital anorectal examination was then performed to assure proper positioning of the band, and to adjust the banded tissue as required.  The patient was discharged home without pain or other issues.  Dietary and behavioral recommendations were given and along with follow-up instructions.      The patient will return in 2-4 weeks for  follow-up and possible additional banding as required. No complications were encountered and the patient tolerated the procedure well.  Iona BeardK. Veena Nandigam , MD [phone] Mon-Fri 8a-5p [phone] after 5p, weekends, holidays

## 2015-12-19 ENCOUNTER — Encounter: Payer: Self-pay | Admitting: Gastroenterology

## 2015-12-19 ENCOUNTER — Ambulatory Visit (INDEPENDENT_AMBULATORY_CARE_PROVIDER_SITE_OTHER): Payer: BLUE CROSS/BLUE SHIELD | Admitting: Gastroenterology

## 2015-12-19 VITALS — BP 112/80 | HR 64 | Ht 62.0 in | Wt 147.2 lb

## 2015-12-19 DIAGNOSIS — K641 Second degree hemorrhoids: Secondary | ICD-10-CM

## 2015-12-19 NOTE — Patient Instructions (Signed)

## 2015-12-19 NOTE — Progress Notes (Signed)
PROCEDURE NOTE: The patient presents with symptomatic grade II  hemorrhoids, requesting rubber band ligation of his/her hemorrhoidal disease.  All risks, benefits and alternative forms of therapy were described and informed consent was obtained.   The anorectum was pre-medicated with 0.125%Nitroglycerine The decision was made to band the Right posterior internal hemorrhoid, and the [facility] O'Regan System was used to perform band ligation without complication.  Digital anorectal examination was then performed to assure proper positioning of the band, and to adjust the banded tissue as required.  The patient was discharged home without pain or other issues.  Dietary and behavioral recommendations were given and along with follow-up instructions.      The patient will return in 2-4 weeks for  follow-up and possible additional banding as required. No complications were encountered and the patient tolerated the procedure well.  K. Veena Nandigam , MD [phone] Mon-Fri 8a-5p [phone] after 5p, weekends, holidays  

## 2016-01-09 ENCOUNTER — Encounter: Payer: Self-pay | Admitting: Gastroenterology

## 2016-01-09 ENCOUNTER — Ambulatory Visit (INDEPENDENT_AMBULATORY_CARE_PROVIDER_SITE_OTHER): Payer: BLUE CROSS/BLUE SHIELD | Admitting: Gastroenterology

## 2016-01-09 DIAGNOSIS — K641 Second degree hemorrhoids: Secondary | ICD-10-CM | POA: Diagnosis not present

## 2016-01-09 NOTE — Patient Instructions (Signed)
HEMORRHOID BANDING PROCEDURE    FOLLOW-UP CARE   1. The procedure you have had should have been relatively painless since the banding of the area involved does not have nerve endings and there is no pain sensation.  The rubber band cuts off the blood supply to the hemorrhoid and the band may fall off as soon as 48 hours after the banding (the band may occasionally be seen in the toilet bowl following a bowel movement). You may notice a temporary feeling of fullness in the rectum which should respond adequately to plain Tylenol or Motrin.  2. Following the banding, avoid strenuous exercise that evening and resume full activity the next day.  A sitz bath (soaking in a warm tub) or bidet is soothing, and can be useful for cleansing the area after bowel movements.     3. To avoid constipation, take two tablespoons of natural wheat bran, natural oat bran, flax, Benefiber or any over the counter fiber supplement and increase your water intake to 7-8 glasses daily.    4. Unless you have been prescribed anorectal medication, do not put anything inside your rectum for two weeks: No suppositories, enemas, fingers, etc.  5. Occasionally, you may have more bleeding than usual after the banding procedure.  This is often from the untreated hemorrhoids rather than the treated one.  Don't be concerned if there is a tablespoon or so of blood.  If there is more blood than this, lie flat with your bottom higher than your head and apply an ice pack to the area. If the bleeding does not stop within a half an hour or if you feel faint, call our office at [phone] or go to the emergency room.  6. Problems are not common; however, if there is a substantial amount of bleeding, severe pain, chills, fever or difficulty passing urine (very rare) or other problems, you should call us at [phone] or report to the nearest emergency room.  7. Do not stay seated continuously for more than 2-3 hours for a day or two  after the procedure.  Tighten your buttock muscles 10-15 times every two hours and take 10-15 deep breaths every 1-2 hours.  Do not spend more than a few minutes on the toilet if you cannot empty your bowel; instead re-visit the toilet at a later time.   Follow up as needed 

## 2016-01-09 NOTE — Progress Notes (Signed)
PROCEDURE NOTE: The patient presents with symptomatic grade II  hemorrhoids, requesting rubber band ligation of his/her hemorrhoidal disease.  All risks, benefits and alternative forms of therapy were described and informed consent was obtained.   The anorectum was pre-medicated with 0.125% Nitroglycerine and Recticare The decision was made to band the Left lateral internal hemorrhoid, and the [facility] O'Regan System was used to perform band ligation without complication.  Digital anorectal examination was then performed to assure proper positioning of the band, and to adjust the banded tissue as required.  The patient was discharged home without pain or other issues.  Dietary and behavioral recommendations were given and along with follow-up instructions.     The patient will return  for  follow-up  as needed No complications were encountered and the patient tolerated the procedure well.  Iona BeardK. Veena Sruti Ayllon , MD [phone] Mon-Fri 8a-5p [phone] after 5p, weekends, holidays

## 2016-05-30 ENCOUNTER — Encounter: Payer: Self-pay | Admitting: Gastroenterology

## 2016-05-30 ENCOUNTER — Ambulatory Visit (INDEPENDENT_AMBULATORY_CARE_PROVIDER_SITE_OTHER): Payer: BLUE CROSS/BLUE SHIELD | Admitting: Gastroenterology

## 2016-05-30 ENCOUNTER — Encounter (INDEPENDENT_AMBULATORY_CARE_PROVIDER_SITE_OTHER): Payer: Self-pay

## 2016-05-30 VITALS — BP 110/68 | HR 78 | Ht 62.0 in | Wt 147.0 lb

## 2016-05-30 DIAGNOSIS — K640 First degree hemorrhoids: Secondary | ICD-10-CM

## 2016-05-30 DIAGNOSIS — K625 Hemorrhage of anus and rectum: Secondary | ICD-10-CM

## 2016-05-30 NOTE — Patient Instructions (Addendum)
Take Benefiber 1 tablespoon three times a day with meals  Use Preparation H at bedtime as needed   Follow up as needed

## 2016-05-30 NOTE — Progress Notes (Signed)
Brittany Stanton    [id number]    Jan 12, 1961  Primary Care Physician:Hedrick, Fayrene Fearing, MD  Referring Physician: Jerl Mina, MD [address] [facility] [place], [place] [address]  Chief complaint:  Hemorrhoids  HPI:  56 year old female with history of hemorrhoids status post-band ligation 3 here for follow-up visit after she had an episode of small amount of bright red blood per rectum when she wiped 2 weeks ago after a bowel movement. She didn't have mild discomfort at the time. Did not have any further episodes of bleeding. She does have intermittent constipation and was excessively straining. Currently denies any nausea, dysphagia, abdominal pain, melena or blood per rectum. Colonoscopy October 2016 was normal other than small internal hemorrhoids.   Outpatient Encounter Prescriptions as of 05/30/2016  Medication Sig  . ACAI BERRY PO Take 1 tablet by mouth 2 (two) times daily.  . clobetasol cream (TEMOVATE) 0.05 % Apply 1 application topically 2 (two) times daily.  . Cyanocobalamin (VITAMIN B-12 PO) Take 5,000 Doses by mouth daily.  Marland Kitchen esomeprazole (NEXIUM) 40 MG capsule Take 1 capsule (40 mg total) by mouth daily before breakfast.  . Probiotic Product (PROBIOTIC-10) CAPS Take 1 capsule by mouth 2 (two) times daily.   . [DISCONTINUED] hydrocortisone (ANUSOL-HC) 25 MG suppository Place 1 suppository (25 mg total) rectally at bedtime.  . [DISCONTINUED] Lactobacillus-Inulin (CULTURELLE DIGESTIVE HEALTH PO) Take 1 tablet by mouth daily.  . [DISCONTINUED] Specialty Vitamins Products (ULTIMATE FAT BURNER PO) Take 1 tablet by mouth 2 (two) times daily.   No facility-administered encounter medications on file as of 05/30/2016.     Allergies as of 05/30/2016 - Review Complete 05/30/2016  Allergen Reaction Noted  . Amoxicillin  10/30/2007  . Penicillins Hives     Past Medical History:  Diagnosis Date  . Abdominal pain, epigastric   . Allergic rhinitis, cause  unspecified   . Anemia   . Arthritis    knees, R hip, jaw- R   . Asthma    when pregnant 1990  . Decreased white blood cell count   . Diabetes [facility])    told that she is pre diabetic   . Dyspepsia and other specified disorders of function of stomach   . Esophageal reflux   . Fatty liver   . Irritable bowel syndrome   . Lichen planus    L leg, followed by Dr.Jones-  diagnosed 2012, by biopsy  . Other abnormal glucose   . Other alteration of consciousness   . Other malaise and fatigue   . Screening for lipoid disorders     Past Surgical History:  Procedure Laterality Date  . ABDOMINAL HYSTERECTOMY  2008  . CERVIX SURGERY      for fibroid tumors  . CESAREAN SECTION  [id number]   x 2  . CHOLECYSTECTOMY N/A 06/09/2012   Procedure: LAPAROSCOPIC CHOLECYSTECTOMY WITH INTRAOPERATIVE CHOLANGIOGRAM;  Surgeon: Adolph Pollack, MD;  Location: [facility] OR;  Service: General;  Laterality: N/A;  . TUBAL LIGATION  1996    Family History  Problem Relation Age of Onset  . Hypertension Mother   . Irritable bowel syndrome Mother   . Thyroid disease Mother   . Pancreatic cancer Mother   . Diabetes Father   . Hypertension Father   . Colon cancer Father   . Hypertension Brother   . GER disease Brother   . Cancer Maternal Grandfather        oral  .  Leukemia Maternal Grandfather   . Hypertension Brother   . GER disease Brother   . Colon cancer Maternal Uncle     Social History   Social History  . Marital status: Married    Spouse name: N/A  . Number of children: N/A  . Years of education: N/A   Occupational History  . Not on file.   Social History Main Topics  . Smoking status: Never Smoker  . Smokeless tobacco: Never Used  . Alcohol use No  . Drug use: No  . Sexual activity: Not on file   Other Topics Concern  . Not on file   Social History Narrative   Regular exercise- yes 2-3 times per week   Diet: fruits and veggies, chicken, fish loves sweets      Review of  systems: Review of Systems  Constitutional: Negative for fever and chills.  HENT: Negative.   Eyes: Negative for blurred vision.  Respiratory: Negative for cough, shortness of breath and wheezing.   Cardiovascular: Negative for chest pain and palpitations.  Gastrointestinal: as per HPI Genitourinary: Negative for dysuria, urgency, frequency and hematuria.  Musculoskeletal: Negative for myalgias, back pain and joint pain.  Skin: Negative for itching and rash.  Neurological: Negative for dizziness, tremors, focal weakness, seizures and loss of consciousness.  Endo/Heme/Allergies: Positive for seasonal allergies.  Psychiatric/Behavioral: Negative for depression, suicidal ideas and hallucinations.  All other systems reviewed and are negative.   Physical Exam: Vitals:   05/30/16 0835  BP: 110/68  Pulse: 78   Body mass index is 26.89 kg/m. Gen:      No acute distress HEENT:  EOMI, sclera anicteric Neck:     No masses; no thyromegaly Lungs:    Clear to auscultation bilaterally; normal respiratory effort CV:         Regular rate and rhythm; no murmurs Abd:      + bowel sounds; soft, non-tender; no palpable masses, no distension Ext:    No edema; adequate peripheral perfusion Skin:      Warm and dry; no rash Neuro: alert and oriented x 3 Psych: normal mood and affect Rectal exam: Normal anal sphincter tone, no anal fissure or external hemorrhoids Anoscopy: Small internal hemorrhoids, no active bleeding, normal dentate line, no visible nodules  Data Reviewed:  Reviewed labs, radiology imaging, old records and pertinent past GI work up   Assessment and Plan/Recommendations:  56 year old female with history of symptomatic internal hemorrhoids status post band ligation 3 here with complaints of small-volume bright red blood per rectum associated with anal discomfort 2 weeks ago Patient may have had small anal fissure versus small-volume bleeding from internal hemorrhoids On  anoscopy grade 1 internal hemorrhoids with no evidence of active bleeding or fissure Advised patient to use Benefiber 1 tablespoon 3 times a day with meals Increase dietary fiber and fluid intake to prevent constipation  Use Preparation H at bedtime as needed if continues to have further episodes of intermittent bright red blood per rectum  Return as needed   Iona BeardK. Veena Nandigam , MD [phone] Mon-Fri 8a-5p [phone] after 5p, weekends, holidays  CC: Jerl MinaHedrick, James, MD

## 2019-02-08 DIAGNOSIS — M545 Low back pain: Secondary | ICD-10-CM | POA: Diagnosis not present

## 2019-02-08 DIAGNOSIS — M542 Cervicalgia: Secondary | ICD-10-CM | POA: Diagnosis not present

## 2019-02-16 DIAGNOSIS — M542 Cervicalgia: Secondary | ICD-10-CM | POA: Diagnosis not present

## 2019-02-19 DIAGNOSIS — M542 Cervicalgia: Secondary | ICD-10-CM | POA: Diagnosis not present

## 2019-09-20 DIAGNOSIS — H6121 Impacted cerumen, right ear: Secondary | ICD-10-CM | POA: Diagnosis not present

## 2019-09-20 DIAGNOSIS — Z20822 Contact with and (suspected) exposure to covid-19: Secondary | ICD-10-CM | POA: Diagnosis not present

## 2019-10-04 DIAGNOSIS — Z1211 Encounter for screening for malignant neoplasm of colon: Secondary | ICD-10-CM | POA: Diagnosis not present

## 2019-10-04 DIAGNOSIS — Z6828 Body mass index (BMI) 28.0-28.9, adult: Secondary | ICD-10-CM | POA: Diagnosis not present

## 2019-10-04 DIAGNOSIS — Z01419 Encounter for gynecological examination (general) (routine) without abnormal findings: Secondary | ICD-10-CM | POA: Diagnosis not present

## 2019-10-11 DIAGNOSIS — Z1231 Encounter for screening mammogram for malignant neoplasm of breast: Secondary | ICD-10-CM | POA: Diagnosis not present

## 2019-12-21 DIAGNOSIS — N898 Other specified noninflammatory disorders of vagina: Secondary | ICD-10-CM | POA: Diagnosis not present

## 2019-12-21 DIAGNOSIS — R109 Unspecified abdominal pain: Secondary | ICD-10-CM | POA: Diagnosis not present

## 2019-12-21 DIAGNOSIS — N939 Abnormal uterine and vaginal bleeding, unspecified: Secondary | ICD-10-CM | POA: Diagnosis not present

## 2020-01-03 DIAGNOSIS — H43811 Vitreous degeneration, right eye: Secondary | ICD-10-CM | POA: Diagnosis not present

## 2020-01-13 DIAGNOSIS — K580 Irritable bowel syndrome with diarrhea: Secondary | ICD-10-CM | POA: Diagnosis not present

## 2020-01-13 DIAGNOSIS — Z800 Family history of malignant neoplasm of digestive organs: Secondary | ICD-10-CM | POA: Diagnosis not present

## 2020-01-13 DIAGNOSIS — Z1211 Encounter for screening for malignant neoplasm of colon: Secondary | ICD-10-CM | POA: Diagnosis not present

## 2020-01-20 DIAGNOSIS — R748 Abnormal levels of other serum enzymes: Secondary | ICD-10-CM | POA: Diagnosis not present

## 2020-02-28 ENCOUNTER — Encounter: Payer: Self-pay | Admitting: Gastroenterology

## 2020-03-03 DIAGNOSIS — Z800 Family history of malignant neoplasm of digestive organs: Secondary | ICD-10-CM | POA: Diagnosis not present

## 2020-03-03 DIAGNOSIS — Z1211 Encounter for screening for malignant neoplasm of colon: Secondary | ICD-10-CM | POA: Diagnosis not present

## 2020-03-31 DIAGNOSIS — Z1211 Encounter for screening for malignant neoplasm of colon: Secondary | ICD-10-CM | POA: Diagnosis not present

## 2020-03-31 DIAGNOSIS — Z800 Family history of malignant neoplasm of digestive organs: Secondary | ICD-10-CM | POA: Diagnosis not present

## 2020-06-29 DIAGNOSIS — R748 Abnormal levels of other serum enzymes: Secondary | ICD-10-CM | POA: Diagnosis not present

## 2020-10-10 DIAGNOSIS — Z1231 Encounter for screening mammogram for malignant neoplasm of breast: Secondary | ICD-10-CM | POA: Diagnosis not present

## 2020-10-10 DIAGNOSIS — Z01419 Encounter for gynecological examination (general) (routine) without abnormal findings: Secondary | ICD-10-CM | POA: Diagnosis not present

## 2020-12-14 ENCOUNTER — Encounter: Payer: Self-pay | Admitting: Emergency Medicine

## 2020-12-14 ENCOUNTER — Emergency Department
Admission: EM | Admit: 2020-12-14 | Discharge: 2020-12-14 | Disposition: A | Discharge status: Home / Self Care | Payer: BC Managed Care – PPO | Attending: Emergency Medicine | Admitting: Emergency Medicine

## 2020-12-14 ENCOUNTER — Emergency Department: Payer: BC Managed Care – PPO

## 2020-12-14 ENCOUNTER — Other Ambulatory Visit: Payer: Self-pay

## 2020-12-14 DIAGNOSIS — J45909 Unspecified asthma, uncomplicated: Secondary | ICD-10-CM | POA: Diagnosis not present

## 2020-12-14 DIAGNOSIS — E119 Type 2 diabetes mellitus without complications: Secondary | ICD-10-CM | POA: Diagnosis not present

## 2020-12-14 DIAGNOSIS — R002 Palpitations: Secondary | ICD-10-CM | POA: Insufficient documentation

## 2020-12-14 DIAGNOSIS — K219 Gastro-esophageal reflux disease without esophagitis: Secondary | ICD-10-CM | POA: Insufficient documentation

## 2020-12-14 DIAGNOSIS — R0602 Shortness of breath: Secondary | ICD-10-CM | POA: Diagnosis not present

## 2020-12-14 LAB — CBC
HCT: 37.5 % (ref 36.0–46.0)
Hemoglobin: 12.4 g/dL (ref 12.0–15.0)
MCH: 31.1 pg (ref 26.0–34.0)
MCHC: 33.1 g/dL (ref 30.0–36.0)
MCV: 94 fL (ref 80.0–100.0)
Platelets: 193 10*3/uL (ref 150–400)
RBC: 3.99 MIL/uL (ref 3.87–5.11)
RDW: 12.3 % (ref 11.5–15.5)
WBC: 4.6 10*3/uL (ref 4.0–10.5)
nRBC: 0 % (ref 0.0–0.2)

## 2020-12-14 LAB — BASIC METABOLIC PANEL
Anion gap: 3 — ABNORMAL LOW (ref 5–15)
BUN: 13 mg/dL (ref 6–20)
CO2: 28 mmol/L (ref 22–32)
Calcium: 9.1 mg/dL (ref 8.9–10.3)
Chloride: 106 mmol/L (ref 98–111)
Creatinine, Ser: 0.68 mg/dL (ref 0.44–1.00)
GFR, Estimated: >60 mL/min (ref >60)
Glucose, Bld: 92 mg/dL (ref 70–99)
Potassium: 3.6 mmol/L (ref 3.5–5.1)
Sodium: 137 mmol/L (ref 135–145)

## 2020-12-14 LAB — TROPONIN I (HIGH SENSITIVITY)
Troponin I (High Sensitivity): <2 ng/L (ref <18)
Troponin I (High Sensitivity): 3 ng/L (ref <18)

## 2020-12-14 MED ORDER — ALUM & MAG HYDROXIDE-SIMETH 200-200-20 MG/5ML PO SUSP
30.0000 mL | Freq: Once | ORAL | Status: AC
  Administered 2020-12-14: 30 mL via ORAL
  Filled 2020-12-14: qty 30

## 2020-12-14 MED ORDER — LIDOCAINE VISCOUS HCL 2 % MT SOLN
15.0000 mL | Freq: Once | OROMUCOSAL | Status: AC
  Administered 2020-12-14: 15 mL via ORAL
  Filled 2020-12-14: qty 15

## 2020-12-14 MED ORDER — FAMOTIDINE 20 MG PO TABS
20.0000 mg | ORAL_TABLET | Freq: Once | ORAL | Status: AC
  Administered 2020-12-14: 20 mg via ORAL
  Filled 2020-12-14: qty 1

## 2020-12-14 NOTE — Discharge Instructions (Signed)
For your symptoms:  Start over-the-counter antacid ("PPI" or "proton pump inhibitor") tomorrow  I'd recommend: - Omeprazole (Prilosec) - Esomeprazole (nexium) - Lansoprazole (prevacid)  The generic versions are okay  Take one pill in the mornings, daily, for 14 days   Avoid foods high in acid content  IF your symptoms do not improve, call your primary doctor and discuss as you would likely benefit from an OUTPATIENT HEART MONITOR (HOLTER), if symptoms do not resolve

## 2020-12-14 NOTE — ED Triage Notes (Signed)
Pt comes into the ED via POV c/o heart palpitations that have been ongoing for a couple weeks.  Pt denies any chest pain at this time but only c/o SHOB.  Pt ambulatory to triage at this time and in NAD with even and unlabored respirations.

## 2020-12-14 NOTE — ED Provider Notes (Signed)
[facility] Emergency Department Provider Note  ____________________________________________   Event Date/Time   First MD Initiated Contact with Patient 12/14/20 1804     (approximate)  I have reviewed the triage vital signs and the nursing notes.   HISTORY  Chief Complaint Palpitations    HPI Brittany Stanton is a 60 y.o. female with past medical history as below here with transient palpitations.  The patient states that for the last 2 to 3 weeks, she has had episodes of increasing frequent palpitations and indigestion type feeling.  She states the symptoms seem to occur when she is eating and last for about 1 hour after eating.  She is also noticed them worse when she lays flat or in certain positions.  She states she feels like she has a fullness type sensation in her chest and epigastric area, radiates up towards her jaw.  She states she feels an occasional fluttering type feeling in this area as well.  No shortness of breath with this.  No nausea or vomiting.  Symptoms do seem related to eating.  She has a history of GERD as well as history of irritable bowel.  Denies any cardiac history.  No shortness of breath.  She has been able to exercise, singing at church, and had no episodes of shortness of breath or dyspnea.  No other complaints.    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, by biopsy   Other abnormal glucose    Other alteration of consciousness    Other malaise and fatigue    Screening for lipoid disorders      Patient Active Problem List   Diagnosis Date Noted   Dysuria 12/15/2012   Biliary dyskinesia 05/12/2012   Hand numbness 09/28/2010   Neck pain 09/28/2010   Right leg pain 09/28/2010   Edema of both legs 09/20/2010   DEPRESSION/ANXIETY 01/13/2008   PALPITATIONS 01/13/2008   Personal history of other diseases of digestive system 10/30/2007   LEUKOPENIA, MILD 02/05/2007   PREDIABETES 02/05/2007   ALLERGIC RHINITIS 01/20/2007   GERD 01/20/2007   IBS 01/20/2007   FATIGUE 01/20/2007    Past Surgical History:  Procedure Laterality Date   ABDOMINAL HYSTERECTOMY  2008   CERVIX SURGERY      for fibroid tumors   CESAREAN SECTION  [id number]   x 2   CHOLECYSTECTOMY N/A 06/09/2012   Procedure: LAPAROSCOPIC CHOLECYSTECTOMY WITH INTRAOPERATIVE CHOLANGIOGRAM;  Surgeon: Adolph Pollack, MD;  Location: [facility] OR;  Service: General;  Laterality: N/A;   TUBAL LIGATION  1996    Prior to Admission medications   Medication Sig Start Date End Date Taking? Authorizing Provider  ACAI BERRY PO Take 1 tablet by mouth 2 (two) times daily.    [provider]  clobetasol cream (TEMOVATE) 0.05 % Apply 1 application topically 2 (two) times daily.    [provider]  Cyanocobalamin (VITAMIN B-12  PO) Take 5,000 Doses by mouth daily.    [provider]  esomeprazole (NEXIUM) 40 MG capsule Take 1 capsule (40 mg total) by mouth daily before breakfast. 07/26/14   Hart Carwin, MD  Probiotic Product (PROBIOTIC-10) CAPS Take 1 capsule by mouth 2 (two) times daily.     [provider]    Allergies Amoxicillin and Penicillins  Family History  Problem Relation Age of Onset   Hypertension Mother    Irritable bowel syndrome Mother    Thyroid disease Mother    Pancreatic cancer Mother    Diabetes Father    Hypertension Father    Colon cancer Father    Hypertension Brother    GER disease Brother    Cancer Maternal Grandfather        oral   Leukemia Maternal Grandfather     Hypertension Brother    GER disease Brother    Colon cancer Maternal Uncle     Social History Social History   Tobacco Use   Smoking status: Never   Smokeless tobacco: Never  Vaping Use   Vaping Use: Never used  Substance Use Topics   Alcohol use: No   Drug use: No    Review of Systems  Review of Systems  Constitutional:  Positive for fatigue. Negative for chills and fever.  HENT:  Negative for sore throat.   Respiratory:  Negative for shortness of breath.   Cardiovascular:  Positive for chest pain and palpitations.  Gastrointestinal:  Negative for abdominal pain.  Genitourinary:  Negative for flank pain.  Musculoskeletal:  Negative for neck pain.  Skin:  Negative for rash and wound.  Allergic/Immunologic: Negative for immunocompromised state.  Neurological:  Negative for weakness and numbness.  Hematological:  Does not bruise/bleed easily.  All other systems reviewed and are negative.   ____________________________________________  PHYSICAL EXAM:      VITAL SIGNS: ED Triage Vitals  Enc Vitals Group     BP 12/14/20 1428 123/80     Pulse Rate 12/14/20 1428 69     Resp 12/14/20 1428 16     Temp 12/14/20 1428 (!) 97.4 F (36.3 C)     Temp Source 12/14/20 1428 Oral     SpO2 12/14/20 1428 96 %     Weight 12/14/20 1405 147 lb 0.8 oz (66.7 kg)     Height 12/14/20 1405  (1.575 m)     Head Circumference --      Peak Flow --      Pain Score 12/14/20 1405 0     Pain Loc --      Pain Edu? --      Excl. in GC? --      Physical Exam Vitals and nursing note reviewed.  Constitutional:      General: She is not in acute distress.    Appearance: She is well-developed.  HENT:     Head: Normocephalic and atraumatic.  Eyes:     Conjunctiva/sclera: Conjunctivae normal.  Cardiovascular:     Rate and Rhythm: Normal rate and regular rhythm.     Heart sounds: Normal heart sounds.  Pulmonary:     Effort: Pulmonary effort is normal. No respiratory distress.      Breath sounds: No wheezing.  Abdominal:     General: Abdomen is flat. There is no distension.     Tenderness: There is no abdominal tenderness. There is no guarding or rebound.  Musculoskeletal:     Cervical back: Neck supple.  Skin:  General: Skin is warm.     Capillary Refill: Capillary refill takes less than 2 seconds.     Findings: No rash.  Neurological:     Mental Status: She is alert and oriented to person, place, and time.     Motor: No abnormal muscle tone.      ____________________________________________   LABS (all labs ordered are listed, but only abnormal results are displayed)  Labs Reviewed  BASIC METABOLIC PANEL - Abnormal; Notable for the following components:      Result Value   Anion gap 3 (*)    All other components within normal limits  CBC  POC URINE PREG, ED  TROPONIN I (HIGH SENSITIVITY)  TROPONIN I (HIGH SENSITIVITY)    ____________________________________________  EKG: Normal sinus rhythm, VR 61. PR 144, QRS 74, QTc 404. No acute St elevations. PVCs noted. ________________________________________  RADIOLOGY All imaging, including plain films, CT scans, and ultrasounds, independently reviewed by me, and interpretations confirmed via formal radiology reads.  ED MD interpretation:   CXR: Clear  Official radiology report(s): DG Chest 2 View  Result Date: 12/14/2020 CLINICAL DATA:  Chest palpitations. Shortness of breath. Additional history provided: Patient reports heart palpitations for several weeks, shortness of breath. EXAM: CHEST - 2 VIEW COMPARISON:  Prior chest radiographs 12/19/2009. FINDINGS: Heart size within normal limits. No appreciable airspace consolidation. No evidence of pleural effusion or pneumothorax. No acute bony abnormality identified. Surgical clips within the upper abdomen. Embolization coils project anterior to the lumbar spine. IMPRESSION: No evidence of acute cardiopulmonary abnormality. Electronically Signed   By: Jackey Loge D.O.   On: 12/14/2020 14:29    ____________________________________________  PROCEDURES   Procedure(s) performed (including Critical Care):  Procedures  ____________________________________________  INITIAL IMPRESSION / MDM / ASSESSMENT AND PLAN / ED COURSE  As part of my medical decision making, I reviewed the following data within the electronic MEDICAL RECORD NUMBER Nursing notes reviewed and incorporated, Old chart reviewed, Notes from prior ED visits, and [place] Controlled Substance Database       *Brittany Stanton was evaluated in Emergency Department on 12/14/2020 for the symptoms described in the history of present illness. She was evaluated in the context of the global COVID-19 pandemic, which necessitated consideration that the patient might be at risk for infection with the SARS-CoV-2 virus that causes COVID-19. Institutional protocols and algorithms that pertain to the evaluation of patients at risk for COVID-19 are in a state of rapid change based on information released by regulatory bodies including the CDC and federal and state organizations. These policies and algorithms were followed during the patient's care in the ED.  Some ED evaluations and interventions may be delayed as a result of limited staffing during the pandemic.*     Medical Decision Making: 60 year old female here with intermittent palpitations and chest pressure.  Clinically, I highly suspect this is related to possible GERD, esophagitis, possibly esophageal spasm.  The symptoms seem to occur primarily with eating and are worsened with lying flat.  She has no associated shortness of breath, diaphoresis, or other evidence suggest cardiac etiology and has been able to exercise and seeing without any difficulty.  EKG is nonischemic and troponin is normal despite fairly constant symptoms, do not suspect ischemia.  She has occasional PVCs but no evidence of significant ectopy or arrhythmia on telemetry.  Otherwise,  abdomen is soft and nontender with no peritonitis.  CBC without leukocytosis or anemia.  BMP unremarkable.  Patient will be discharged with close  outpatient follow-up and trial of antacids.  Return precautions given.  ____________________________________________  FINAL CLINICAL IMPRESSION(S) / ED DIAGNOSES  Final diagnoses:  Palpitations  Gastroesophageal reflux disease without esophagitis     MEDICATIONS GIVEN DURING THIS VISIT:  Medications  alum & mag hydroxide-simeth (MAALOX/MYLANTA) 200-200-20 MG/5ML suspension 30 mL (has no administration in time range)    And  lidocaine (XYLOCAINE) 2 % viscous mouth solution 15 mL (has no administration in time range)  famotidine (PEPCID) tablet 20 mg (20 mg Oral Given 12/14/20 1857)     ED Discharge Orders     None        Note:  This document was prepared using Dragon voice recognition software and may include unintentional dictation errors.   Shaune Pollack, MD 12/14/20 [phone]

## 2020-12-20 DIAGNOSIS — Z1211 Encounter for screening for malignant neoplasm of colon: Secondary | ICD-10-CM | POA: Diagnosis not present

## 2020-12-20 DIAGNOSIS — Z1239 Encounter for other screening for malignant neoplasm of breast: Secondary | ICD-10-CM | POA: Diagnosis not present

## 2020-12-20 DIAGNOSIS — Z0001 Encounter for general adult medical examination with abnormal findings: Secondary | ICD-10-CM | POA: Diagnosis not present

## 2020-12-20 DIAGNOSIS — R002 Palpitations: Secondary | ICD-10-CM | POA: Diagnosis not present

## 2020-12-20 DIAGNOSIS — Z124 Encounter for screening for malignant neoplasm of cervix: Secondary | ICD-10-CM | POA: Diagnosis not present

## 2020-12-20 DIAGNOSIS — Z9189 Other specified personal risk factors, not elsewhere classified: Secondary | ICD-10-CM | POA: Diagnosis not present

## 2020-12-20 DIAGNOSIS — K219 Gastro-esophageal reflux disease without esophagitis: Secondary | ICD-10-CM | POA: Diagnosis not present

## 2020-12-20 DIAGNOSIS — F419 Anxiety disorder, unspecified: Secondary | ICD-10-CM | POA: Diagnosis not present

## 2020-12-21 DIAGNOSIS — K5900 Constipation, unspecified: Secondary | ICD-10-CM | POA: Diagnosis not present

## 2020-12-21 DIAGNOSIS — K224 Dyskinesia of esophagus: Secondary | ICD-10-CM | POA: Diagnosis not present

## 2020-12-21 DIAGNOSIS — K219 Gastro-esophageal reflux disease without esophagitis: Secondary | ICD-10-CM | POA: Diagnosis not present

## 2023-07-12 IMAGING — CR DG CHEST 2V
2 series · 2 of 2 positions shown · non-contrast
Comparison: Prior chest radiographs 12/19/2009.

CLINICAL DATA: Chest palpitations. Shortness of breath. Additional
history provided: Patient reports heart palpitations for several
weeks, shortness of breath.

EXAM:
CHEST - 2 VIEW

[chest pa]
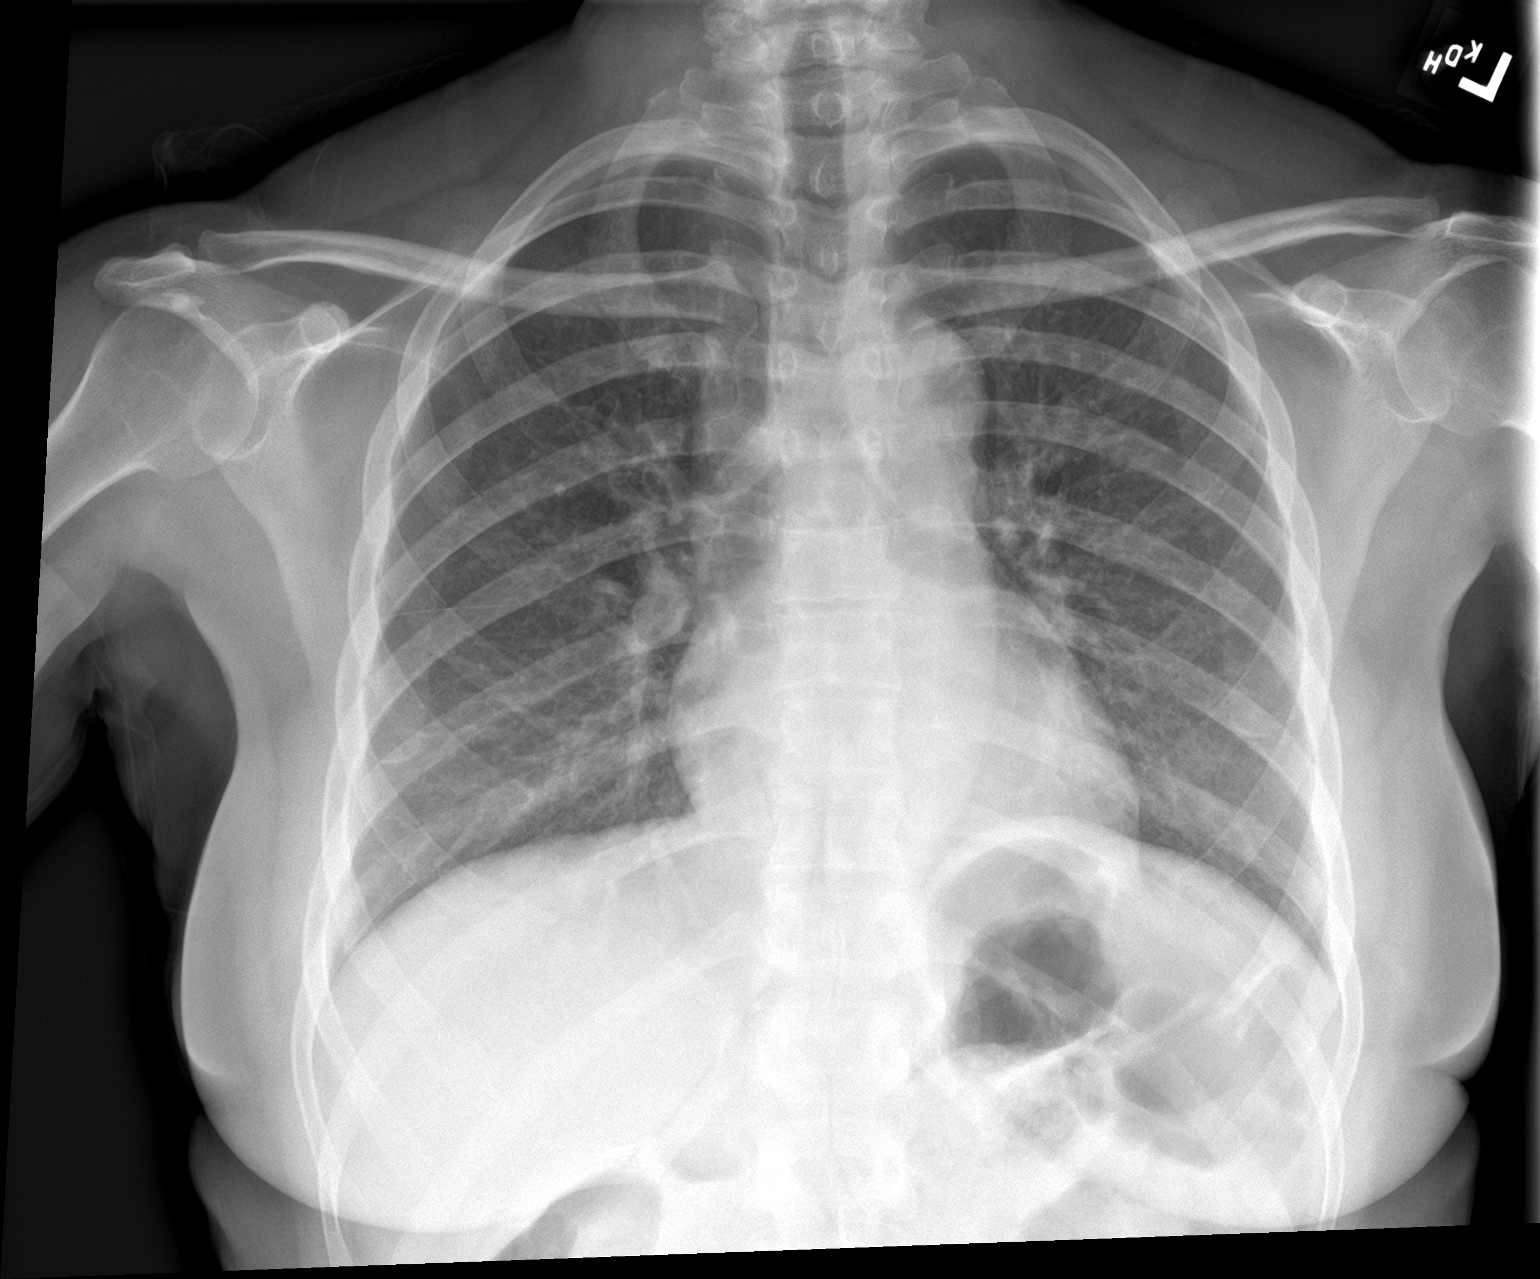

[chest lat]
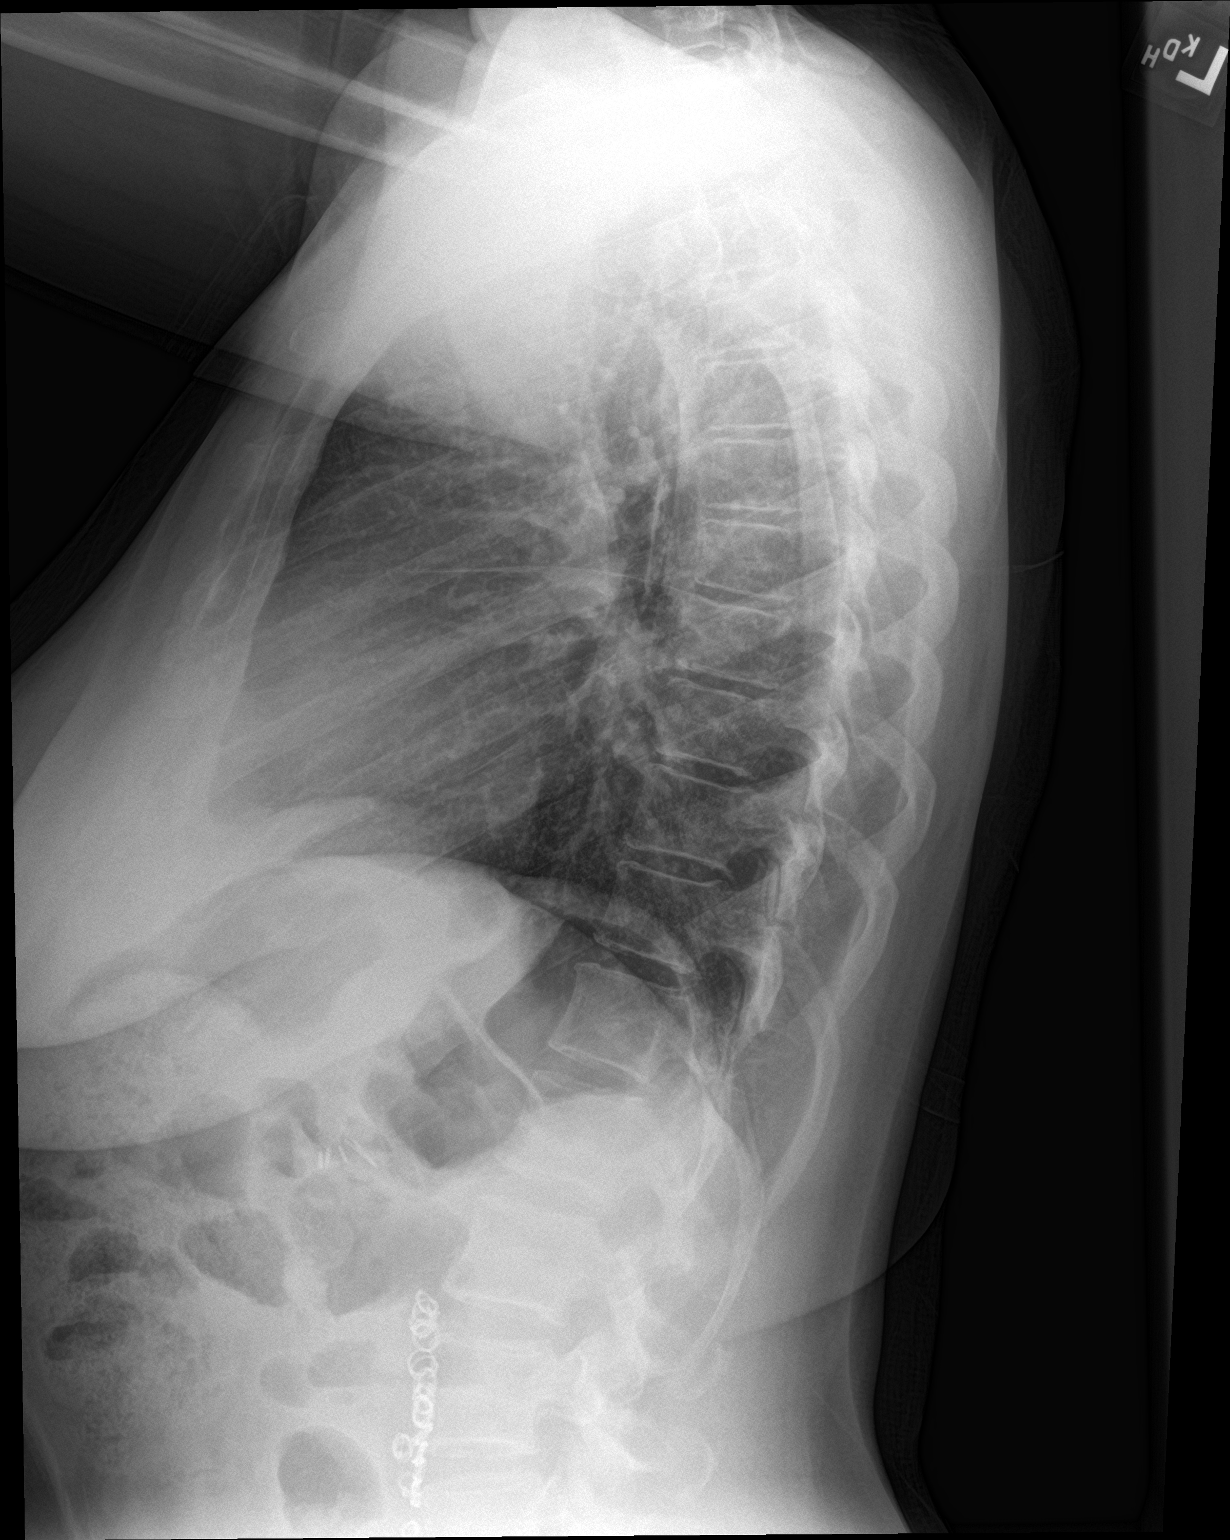

[2 of 2 positions shown; findings below may reference images not displayed]

FINDINGS: Heart size within normal limits. No appreciable airspace
consolidation. No evidence of pleural effusion or pneumothorax. No
acute bony abnormality identified. Surgical clips within the upper
abdomen. Embolization coils project anterior to the lumbar spine.
IMPRESSION: No evidence of acute cardiopulmonary abnormality.

## 2023-11-24 ENCOUNTER — Other Ambulatory Visit: Payer: Self-pay | Admitting: Family Medicine

## 2023-11-24 DIAGNOSIS — M542 Cervicalgia: Secondary | ICD-10-CM

## 2023-11-27 ENCOUNTER — Ambulatory Visit: Admission: RE | Admit: 2023-11-27 | Source: Ambulatory Visit

## 2023-12-06 ENCOUNTER — Ambulatory Visit
Admission: RE | Admit: 2023-12-06 | Discharge: 2023-12-06 | Disposition: A | Discharge status: Home / Self Care | Source: Ambulatory Visit | Attending: Family Medicine | Admitting: Family Medicine

## 2023-12-06 DIAGNOSIS — M542 Cervicalgia: Secondary | ICD-10-CM | POA: Diagnosis present
